# Patient Record
Sex: Male | Born: 1950 | Race: White | Hispanic: No | Marital: Married | Smoking: Never smoker
Health system: Southern US, Community
[De-identification: ages and names within clinical notes are randomized; demographics above are authoritative.]

## PROBLEM LIST (undated history)

## (undated) DIAGNOSIS — Z87442 Personal history of urinary calculi: Secondary | ICD-10-CM

## (undated) DIAGNOSIS — I10 Essential (primary) hypertension: Secondary | ICD-10-CM

## (undated) DIAGNOSIS — E785 Hyperlipidemia, unspecified: Secondary | ICD-10-CM

## (undated) DIAGNOSIS — M1990 Unspecified osteoarthritis, unspecified site: Secondary | ICD-10-CM

## (undated) HISTORY — DX: Essential (primary) hypertension: I10

## (undated) HISTORY — PX: MANDIBLE SURGERY: SHX707

## (undated) HISTORY — PX: HERNIA REPAIR: SHX51

## (undated) HISTORY — DX: Hyperlipidemia, unspecified: E78.5

---

## 2009-04-05 ENCOUNTER — Ambulatory Visit: Payer: Self-pay | Admitting: Unknown Physician Specialty

## 2009-04-05 LAB — HM COLONOSCOPY

## 2010-02-14 ENCOUNTER — Ambulatory Visit: Payer: Self-pay | Admitting: Surgery

## 2013-12-16 LAB — CBC AND DIFFERENTIAL
HCT: 47 % (ref 41–53)
HEMOGLOBIN: 16.8 g/dL (ref 13.5–17.5)
NEUTROS ABS: 57 /uL
PLATELETS: 226 10*3/uL (ref 150–399)
WBC: 6.9 10^3/mL

## 2013-12-16 LAB — HEPATIC FUNCTION PANEL
ALT: 15 U/L (ref 10–40)
AST: 25 U/L (ref 14–40)
Alkaline Phosphatase: 48 U/L (ref 25–125)
BILIRUBIN, TOTAL: 0.6 mg/dL

## 2013-12-16 LAB — PSA: PSA: 1.6

## 2013-12-16 LAB — LIPID PANEL
Cholesterol: 207 mg/dL — AB (ref 0–200)
HDL: 54 mg/dL (ref 35–70)
LDL CALC: 136 mg/dL
TRIGLYCERIDES: 85 mg/dL (ref 40–160)

## 2013-12-16 LAB — TSH: TSH: 2.63 u[IU]/mL (ref <=5.90)

## 2013-12-16 LAB — BASIC METABOLIC PANEL
BUN: 23 mg/dL — AB (ref 4–21)
CREATININE: 1 mg/dL (ref <=1.3)
Glucose: 103 mg/dL
Potassium: 4.5 mmol/L (ref 3.4–5.3)
Sodium: 140 mmol/L (ref 137–147)

## 2014-12-27 DIAGNOSIS — Z86010 Personal history of colonic polyps: Secondary | ICD-10-CM | POA: Insufficient documentation

## 2014-12-27 DIAGNOSIS — E785 Hyperlipidemia, unspecified: Secondary | ICD-10-CM | POA: Insufficient documentation

## 2014-12-27 DIAGNOSIS — J309 Allergic rhinitis, unspecified: Secondary | ICD-10-CM | POA: Insufficient documentation

## 2014-12-27 DIAGNOSIS — Z872 Personal history of diseases of the skin and subcutaneous tissue: Secondary | ICD-10-CM | POA: Insufficient documentation

## 2014-12-27 DIAGNOSIS — M1990 Unspecified osteoarthritis, unspecified site: Secondary | ICD-10-CM | POA: Insufficient documentation

## 2014-12-28 ENCOUNTER — Ambulatory Visit (INDEPENDENT_AMBULATORY_CARE_PROVIDER_SITE_OTHER): Payer: BC Managed Care – PPO | Admitting: Family Medicine

## 2014-12-28 ENCOUNTER — Encounter: Payer: Self-pay | Admitting: Family Medicine

## 2014-12-28 VITALS — BP 108/76 | HR 72 | Temp 98.8°F | Resp 12 | Ht 69.25 in | Wt 213.0 lb

## 2014-12-28 DIAGNOSIS — Z125 Encounter for screening for malignant neoplasm of prostate: Secondary | ICD-10-CM | POA: Diagnosis not present

## 2014-12-28 DIAGNOSIS — Z0000 Encounter for general adult medical examination without abnormal findings: Secondary | ICD-10-CM | POA: Diagnosis not present

## 2014-12-28 DIAGNOSIS — E7800 Pure hypercholesterolemia, unspecified: Secondary | ICD-10-CM

## 2014-12-28 LAB — POCT URINALYSIS DIPSTICK
Bilirubin, UA: NEGATIVE
Blood, UA: NEGATIVE
Glucose, UA: NEGATIVE
KETONES UA: NEGATIVE
Leukocytes, UA: NEGATIVE
Nitrite, UA: NEGATIVE
PH UA: 7
Protein, UA: NEGATIVE
SPEC GRAV UA: 1.015
Urobilinogen, UA: NEGATIVE

## 2014-12-28 LAB — HEMOCCULT GUIAC POC 1CARD (OFFICE): Fecal Occult Blood, POC: NEGATIVE

## 2014-12-28 MED ORDER — ROSUVASTATIN CALCIUM 10 MG PO TABS: 10.0000 mg | ORAL_TABLET | Freq: Every day | ORAL | Status: DC

## 2014-12-28 NOTE — Progress Notes (Signed)
Patient ID: Gabriel Mccormick. [name], male   DOB: September 05, 1950, 64 y.o.   MRN: [id number] Patient: Gabriel Mccormick, Male    DOB: 1950-09-18, 64 y.o.   MRN: [id number] Visit Date: 12/28/2014  Today's Provider: Wilhemena Durie, MD   Chief Complaint  Patient presents with  . Annual Exam   Subjective:  Gabriel Mccormick is a 64 y.o. male who presents today for health maintenance and complete physical. He feels well. He reports exercising almost daily basis walking, playing golf and yard work. He reports he is sleeping well.   Review of Systems  Constitutional: Negative.   HENT: Negative.   Eyes: Negative.   Respiratory: Negative.   Cardiovascular: Negative.   Gastrointestinal: Negative.   Endocrine: Negative.   Genitourinary: Negative.   Musculoskeletal: Positive for back pain (also some leg pain too, this does bother him with trying to mow his yard for example).  Skin: Negative.   Allergic/Immunologic: Negative.   Neurological: Negative.   Hematological: Negative.   Psychiatric/Behavioral: Negative.     History   Social History  . Marital Status: Married    Spouse Name: N/A  . Number of Children: 3  . Years of Education: college   Occupational History  . retired    Social History Main Topics  . Smoking status: Never Smoker   . Smokeless tobacco: Never Used  . Alcohol Use: Yes     Comment: maybe 3 drinks a month  . Drug Use: No  . Sexual Activity: Yes    Birth Control/ Protection: None   Other Topics Concern  . Not on file   Social History Narrative    History reviewed. No pertinent past medical history.  Past Surgical History  Procedure Laterality Date  . Hernia repair    . Mandible surgery      asymmetry corrected    His family history includes Cancer in his maternal grandfather; Dementia in his mother; Diabetes in his maternal grandmother and paternal grandmother; GER disease in his mother; Heart attack in his paternal grandmother; Leukemia in his paternal  grandfather; Lung cancer in his father.    Previous Medications   ASPIRIN 81 MG TABLET    Take by mouth.   IBUPROFEN (ADVIL,MOTRIN) 200 MG TABLET    Take by mouth.   MULTIPLE VITAMIN PO    Take by mouth.   OMEGA-3 FATTY ACIDS (FISH OIL) 1200 MG CAPS    Take by mouth.    Patient Care Team: Jerrol Banana., MD as PCP - General (Family Medicine)     Objective:   Vitals:  Filed Vitals:   12/28/14 0949  BP: 108/76  Pulse: 72  Temp: 98.8 F (37.1 C)  Resp: 12  Height: 5' 9.25" (1.759 m)  Weight: 213 lb (96.616 kg)    Physical Exam  Constitutional: He appears well-developed and well-nourished.  HENT:  Head: Normocephalic and atraumatic.  Right Ear: External ear normal.  Left Ear: External ear normal.  Mouth/Throat: Oropharynx is clear and moist.  Bilateral cerumen present  Eyes: Conjunctivae are normal. Pupils are equal, round, and reactive to light.  Neck: Normal range of motion.  Cardiovascular: Normal rate, regular rhythm and normal heart sounds.   Pulmonary/Chest: Effort normal and breath sounds normal.  Abdominal: Soft. Bowel sounds are normal.  Genitourinary: Rectum normal, prostate normal and penis normal. Guaiac negative stool.  Musculoskeletal: Normal range of motion.  Neurological: He is alert.  Skin: Skin is warm and dry.  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 His behavior is normal. Judgment and thought content normal.  Nursing note and vitals reviewed.      Assessment & Plan:     Routine Health Maintenance and Physical Exam  Exercise Activities and Dietary recommendations Goals    Discussed continuing working on habits.      Immunization History  Administered Date(s) Administered  . Tdap 12/01/2008  . Zoster 12/12/2011    Health Maintenance  Topic Date Due  . HIV Screening  06/08/1966  . INFLUENZA VACCINE  02/27/2015  . TETANUS/TDAP  12/02/2018  . COLONOSCOPY   04/06/2019  . ZOSTAVAX  Completed      Discussed health benefits of physical activity, and encouraged him to engage in regular exercise appropriate for his age and condition.    ------------------------------------------------------------------------------------------------------------         1. Annual physical exam Per patient had colonoscopy with Dr. Vira Agar in 2015 and he will provide that report. - POCT urinalysis dipstick - CBC w/Diff - Comp Met (CMET) - Lipid Panel With LDL/HDL Ratio - TSH - POCT Occult Blood Stool  2. Prostate cancer screening  - PSA  3. Elevated cholesterol Refill provided. - rosuvastatin (CRESTOR) 10 MG tablet; Take 1 tablet (10 mg total) by mouth daily.  Dispense: 30 tablet; Refill: 12  Patient was seen and examined by Dr. Eulas Post and note was scribed by Theressa Millard, RMA. I have done the exam and reviewed the above chart and it is accurate to the best of my knowledge.

## 2014-12-28 NOTE — Patient Instructions (Signed)
See Dermatologist

## 2014-12-29 ENCOUNTER — Encounter: Payer: Self-pay | Admitting: Family Medicine

## 2014-12-29 LAB — COMPREHENSIVE METABOLIC PANEL
ALBUMIN: 4.9 g/dL — AB (ref 3.6–4.8)
ALK PHOS: 49 IU/L (ref 39–117)
ALT: 15 IU/L (ref 0–44)
AST: 18 IU/L (ref 0–40)
Albumin/Globulin Ratio: 1.8 (ref 1.1–2.5)
BUN / CREAT RATIO: 20 (ref 10–22)
BUN: 22 mg/dL (ref 8–27)
Bilirubin Total: 0.8 mg/dL (ref 0.0–1.2)
CALCIUM: 9.9 mg/dL (ref 8.6–10.2)
CO2: 25 mmol/L (ref 18–29)
CREATININE: 1.11 mg/dL (ref 0.76–1.27)
Chloride: 97 mmol/L (ref 97–108)
GFR calc Af Amer: 81 mL/min/{1.73_m2} (ref >59)
GFR calc non Af Amer: 70 mL/min/{1.73_m2} (ref >59)
Globulin, Total: 2.7 g/dL (ref 1.5–4.5)
Glucose: 99 mg/dL (ref 65–99)
POTASSIUM: 4.8 mmol/L (ref 3.5–5.2)
SODIUM: 139 mmol/L (ref 134–144)
Total Protein: 7.6 g/dL (ref 6.0–8.5)

## 2014-12-29 LAB — LIPID PANEL WITH LDL/HDL RATIO
CHOLESTEROL TOTAL: 208 mg/dL — AB (ref 100–199)
HDL: 52 mg/dL (ref >39)
LDL Calculated: 130 mg/dL — ABNORMAL HIGH (ref 0–99)
LDl/HDL Ratio: 2.5 ratio units (ref 0.0–3.6)
TRIGLYCERIDES: 131 mg/dL (ref 0–149)
VLDL CHOLESTEROL CAL: 26 mg/dL (ref 5–40)

## 2014-12-29 LAB — CBC WITH DIFFERENTIAL/PLATELET
BASOS: 1 %
Basophils Absolute: 0 10*3/uL (ref 0.0–0.2)
EOS (ABSOLUTE): 0.2 10*3/uL (ref 0.0–0.4)
Eos: 2 %
HEMATOCRIT: 47.6 % (ref 37.5–51.0)
Hemoglobin: 16.6 g/dL (ref 12.6–17.7)
IMMATURE GRANS (ABS): 0 10*3/uL (ref 0.0–0.1)
Immature Granulocytes: 0 %
Lymphocytes Absolute: 1.8 10*3/uL (ref 0.7–3.1)
Lymphs: 22 %
MCH: 30.9 pg (ref 26.6–33.0)
MCHC: 34.9 g/dL (ref 31.5–35.7)
MCV: 89 fL (ref 79–97)
Monocytes Absolute: 0.7 10*3/uL (ref 0.1–0.9)
Monocytes: 9 %
NEUTROS PCT: 66 %
Neutrophils Absolute: 5.4 10*3/uL (ref 1.4–7.0)
Platelets: 228 10*3/uL (ref 150–379)
RBC: 5.38 x10E6/uL (ref 4.14–5.80)
RDW: 13 % (ref 12.3–15.4)
WBC: 8.2 10*3/uL (ref 3.4–10.8)

## 2014-12-29 LAB — PSA: PROSTATE SPECIFIC AG, SERUM: 1.4 ng/mL (ref 0.0–4.0)

## 2014-12-29 LAB — TSH: TSH: 2.72 u[IU]/mL (ref 0.450–4.500)

## 2015-01-02 ENCOUNTER — Telehealth: Payer: Self-pay | Admitting: Family Medicine

## 2015-01-02 NOTE — Telephone Encounter (Signed)
Advised that the results have not yet been signed off by Dr Reece AgarG but we will call when they are done.  ED

## 2015-01-02 NOTE — Telephone Encounter (Signed)
Pt is requesting lab results.  CB#[phone]/[id number]

## 2015-01-02 NOTE — Telephone Encounter (Signed)
Pt wanted to know if we had the lab results form 12/28/14 when he was here for his CPE. Thanks TNP

## 2015-01-03 NOTE — Telephone Encounter (Signed)
Pt advised-aa 

## 2015-01-03 NOTE — Telephone Encounter (Signed)
-----   Message from Maple Hudsonichard L Gilbert Jr., MD sent at 01/03/2015 11:16 AM EDT ----- Labs stable. Please advise patient.

## 2015-04-06 ENCOUNTER — Encounter: Payer: Self-pay | Admitting: Family Medicine

## 2015-04-06 ENCOUNTER — Ambulatory Visit (INDEPENDENT_AMBULATORY_CARE_PROVIDER_SITE_OTHER): Payer: BC Managed Care – PPO | Admitting: Family Medicine

## 2015-04-06 VITALS — BP 120/68 | HR 64 | Temp 97.9°F | Resp 16 | Wt 218.0 lb

## 2015-04-06 DIAGNOSIS — L255 Unspecified contact dermatitis due to plants, except food: Secondary | ICD-10-CM | POA: Diagnosis not present

## 2015-04-06 MED ORDER — MOMETASONE FUROATE 0.1 % EX CREA: 1.0000 "application " | TOPICAL_CREAM | Freq: Every day | CUTANEOUS | Status: DC

## 2015-04-06 MED ORDER — PREDNISONE 10 MG (48) PO TBPK: ORAL_TABLET | Freq: Every day | ORAL | Status: DC

## 2015-04-06 NOTE — Progress Notes (Signed)
Patient ID: Gabriel Mccormick, male   DOB: 1951/02/25, 64 y.o.   MRN: [id number]    Subjective:  HPI Pt reports that about a week ago he came home from playing golf and noticed a tick on his left groin area. He pulled it off and he had a red mark and a little rash around it, has not gotten bigger but then he noticed his foot on the same side as the tick bite was itching and now has a circular rash on his left foot that is not raised about the skin just red and itchy. He denies pain in this area and reports that he feels fine. He is going to the beach next week and wanted to get this checked out before he went.   Prior to Admission medications   Medication Sig Start Date End Date Taking? Authorizing Provider  aspirin 81 MG tablet Take by mouth. 12/15/12  Yes Historical Provider, MD  ibuprofen (ADVIL,MOTRIN) 200 MG tablet Take by mouth.   Yes Historical Provider, MD  MULTIPLE VITAMIN PO Take by mouth. 12/15/12  Yes Historical Provider, MD  Omega-3 Fatty Acids (FISH OIL) 1200 MG CAPS Take by mouth. 12/15/12  Yes Historical Provider, MD  rosuvastatin (CRESTOR) 10 MG tablet Take 1 tablet (10 mg total) by mouth daily. 12/28/14  Yes Violet Hulen Shouts., MD    Patient Active Problem List   Diagnosis Date Noted  . Allergic rhinitis 12/27/2014  . Personal history of disease of skin and subcutaneous tissue 12/27/2014  . History of colon polyps 12/27/2014  . HLD (hyperlipidemia) 12/27/2014  . Arthritis, degenerative 12/27/2014    History reviewed. No pertinent past medical history.  Social History   Social History  . Marital Status: Married    Spouse Name: N/A  . Number of Children: 3  . Years of Education: college   Occupational History  . retired    Social History Main Topics  . Smoking status: Never Smoker   . Smokeless tobacco: Never Used  . Alcohol Use: 0.0 oz/week    0 Standard drinks or equivalent per week     Comment: maybe 3 drinks a month  . Drug Use: No  . Sexual Activity:  Yes    Birth Control/ Protection: None   Other Topics Concern  . Not on file   Social History Narrative    No Known Allergies  Review of Systems  Constitutional: Negative.   HENT: Negative.   Eyes: Negative.   Respiratory: Negative.   Cardiovascular: Negative.   Gastrointestinal: Negative.   Genitourinary: Negative.   Musculoskeletal: Negative.   Skin: Positive for itching and rash.  Neurological: Negative.   Endo/Heme/Allergies: Negative.   Psychiatric/Behavioral: Negative.     Immunization History  Administered Date(s) Administered  . Tdap 12/01/2008  . Zoster 12/12/2011   Objective:  BP 120/68 mmHg  Pulse 64  Temp(Src) 97.9 F (36.6 C) (Oral)  Resp 16  Wt 218 lb (98.884 kg)  Physical Exam  Constitutional: He is oriented to person, place, and time and well-developed, well-nourished, and in no distress.  HENT:  Head: Normocephalic and atraumatic.  Right Ear: External ear normal.  Left Ear: External ear normal.  Nose: Nose normal.  Eyes: Conjunctivae are normal.  Neck: Neck supple.  Cardiovascular: Normal rate, regular rhythm and normal heart sounds.   Pulmonary/Chest: Effort normal and breath sounds normal.  Abdominal: Soft.  Neurological: He is alert and oriented to person, place, and time.  Skin: Skin is warm and dry.  Psychiatric: Mood, memory, affect and judgment normal.    Lab Results  Component Value Date   WBC 8.2 12/28/2014   HGB 16.8 12/16/2013   HCT 47.6 12/28/2014   PLT 226 12/16/2013   GLUCOSE 99 12/28/2014   CHOL 208* 12/28/2014   TRIG 131 12/28/2014   HDL 52 12/28/2014   LDLCALC 130* 12/28/2014   TSH 2.720 12/28/2014   PSA 1.4 12/28/2014    CMP     Component Value Date/Time   NA 139 12/28/2014 1049   K 4.8 12/28/2014 1049   CL 97 12/28/2014 1049   CO2 25 12/28/2014 1049   GLUCOSE 99 12/28/2014 1049   BUN 22 12/28/2014 1049   CREATININE 1.11 12/28/2014 1049   CREATININE 1.0 12/16/2013   CALCIUM 9.9 12/28/2014 1049   PROT  7.6 12/28/2014 1049   AST 18 12/28/2014 1049   ALT 15 12/28/2014 1049   ALKPHOS 49 12/28/2014 1049   BILITOT 0.8 12/28/2014 1049   GFRNONAA 70 12/28/2014 1049   GFRAA 81 12/28/2014 1049    Assessment and Plan :  1. Rhus dermatitis Benadryl/loratadine/ranitadine for itching. - mometasone (ELOCON) 0.1 % cream; Apply 1 application topically daily.  Dispense: 30 g; Refill: 0 - predniSONE (STERAPRED UNI-PAK 48 TAB) 10 MG (48) TBPK tablet; Take by mouth daily.  Dispense: 48 tablet; Refill: 0  2.Tick Bite Resolving. Julieanne Manson MD [facility] 04/06/2015 1:49 PM

## 2015-04-10 ENCOUNTER — Telehealth: Payer: Self-pay

## 2015-04-10 DIAGNOSIS — L03818 Cellulitis of other sites: Secondary | ICD-10-CM

## 2015-04-10 MED ORDER — DOXYCYCLINE HYCLATE 100 MG PO TABS: 100.0000 mg | ORAL_TABLET | Freq: Two times a day (BID) | ORAL | Status: DC

## 2015-04-10 NOTE — Telephone Encounter (Signed)
Per Dr. Gilbert-aa 

## 2016-01-02 ENCOUNTER — Ambulatory Visit (INDEPENDENT_AMBULATORY_CARE_PROVIDER_SITE_OTHER): Payer: BC Managed Care – PPO | Admitting: Family Medicine

## 2016-01-02 ENCOUNTER — Other Ambulatory Visit: Payer: Self-pay | Admitting: Family Medicine

## 2016-01-02 VITALS — BP 108/66 | HR 56 | Temp 98.0°F | Resp 16 | Ht 70.5 in | Wt 216.0 lb

## 2016-01-02 DIAGNOSIS — Z1211 Encounter for screening for malignant neoplasm of colon: Secondary | ICD-10-CM

## 2016-01-02 DIAGNOSIS — Z125 Encounter for screening for malignant neoplasm of prostate: Secondary | ICD-10-CM | POA: Diagnosis not present

## 2016-01-02 DIAGNOSIS — Z0000 Encounter for general adult medical examination without abnormal findings: Secondary | ICD-10-CM | POA: Diagnosis not present

## 2016-01-02 LAB — POCT URINALYSIS DIPSTICK
BILIRUBIN UA: NEGATIVE
GLUCOSE UA: NEGATIVE
Ketones, UA: NEGATIVE
Leukocytes, UA: NEGATIVE
Nitrite, UA: NEGATIVE
PH UA: 6
Protein, UA: NEGATIVE
RBC UA: NEGATIVE
SPEC GRAV UA: 1.015
Urobilinogen, UA: NEGATIVE

## 2016-01-02 LAB — IFOBT (OCCULT BLOOD): IFOBT: NEGATIVE

## 2016-01-02 NOTE — Progress Notes (Signed)
Patient ID: Gabriel Mccormick, male   DOB: 06-Oct-1950, 65 y.o.   MRN: [id number] Patient: Gabriel Mccormick, Male    DOB: [birth year], 65 y.o.   MRN: [id number] Visit Date: 01/02/2016  Today's Provider: Megan Mans, MD   Chief Complaint  Patient presents with  . Annual Exam   Subjective:  Gabriel Mccormick is a 65 y.o. male who presents today for health maintenance and complete physical. He feels well. He reports exercising daily. He reports he is sleeping well.  Immunization History  Administered Date(s) Administered  . Tdap 12/01/2008  . Zoster 12/12/2011   07/06/14 Colonoscopy -internal hemorrhoids, repeat in 5 years   Review of Systems  Constitutional: Negative.   HENT: Negative.   Eyes: Negative.   Respiratory: Negative.   Cardiovascular: Negative.   Gastrointestinal: Negative.   Endocrine: Negative.   Genitourinary: Negative.   Musculoskeletal: Negative.   Skin: Negative.   Allergic/Immunologic: Negative.   Neurological: Negative.   Hematological: Negative.   Psychiatric/Behavioral: Negative.     Social History   Social History  . Marital Status: Married    Spouse Name: N/A  . Number of Children: 3  . Years of Education: college   Occupational History  . retired    Social History Main Topics  . Smoking status: Never Smoker   . Smokeless tobacco: Never Used  . Alcohol Use: 0.0 oz/week    0 Standard drinks or equivalent per week     Comment: maybe 3 drinks a month  . Drug Use: No  . Sexual Activity: Yes    Birth Control/ Protection: None   Other Topics Concern  . Not on file   Social History Narrative    Patient Active Problem List   Diagnosis Date Noted  . Allergic rhinitis 12/27/2014  . Personal history of disease of skin and subcutaneous tissue 12/27/2014  . History of colon polyps 12/27/2014  . HLD (hyperlipidemia) 12/27/2014  . Arthritis, degenerative 12/27/2014    Past Surgical History  Procedure Laterality Date  . Hernia repair     . Mandible surgery      asymmetry corrected    His family history includes Cancer in his maternal grandfather; Dementia in his mother; Diabetes in his maternal grandmother and paternal grandmother; GER disease in his mother; Heart attack in his paternal grandmother; Leukemia in his paternal grandfather; Lung cancer in his father.    Outpatient Prescriptions Prior to Visit  Medication Sig Dispense Refill  . aspirin 81 MG tablet Take by mouth.    Marland Kitchen ibuprofen (ADVIL,MOTRIN) 200 MG tablet Take by mouth.    . MULTIPLE VITAMIN PO Take by mouth.    . Omega-3 Fatty Acids (FISH OIL) 1200 MG CAPS Take by mouth.    . rosuvastatin (CRESTOR) 10 MG tablet Take 1 tablet (10 mg total) by mouth daily. 30 tablet 12  . doxycycline (VIBRA-TABS) 100 MG tablet Take 1 tablet (100 mg total) by mouth 2 (two) times daily. 14 tablet 1  . mometasone (ELOCON) 0.1 % cream Apply 1 application topically daily. 30 g 0  . predniSONE (STERAPRED UNI-PAK 48 TAB) 10 MG (48) TBPK tablet Take by mouth daily. 48 tablet 0   No facility-administered medications prior to visit.    Patient Care Team: Maple Hudson., MD as PCP - General (Family Medicine)     Objective:   Vitals:  Filed Vitals:   01/02/16 0821  BP: 108/66  Pulse: 56  Temp: 98 F (36.7 C)  TempSrc:  Oral  Resp: 16  Height: 5' 10.5" (1.791 m)  Weight: 216 lb (97.977 kg)    Physical Exam  Constitutional: He is oriented to person, place, and time. He appears well-developed and well-nourished.  HENT:  Head: Normocephalic and atraumatic.  Right Ear: External ear normal.  Left Ear: External ear normal.  Nose: Nose normal.  Mouth/Throat: Oropharynx is clear and moist.  Eyes: Conjunctivae and EOM are normal. Pupils are equal, round, and reactive to light.  Neck: Normal range of motion. Neck supple.  Cardiovascular: Normal rate, regular rhythm, normal heart sounds and intact distal pulses.   Pulmonary/Chest: Effort normal and breath sounds normal.   Abdominal: Soft. Bowel sounds are normal.  Genitourinary: Rectum normal, prostate normal and penis normal.  Musculoskeletal: Normal range of motion.  Neurological: He is alert and oriented to person, place, and time.  Skin: Skin is warm and dry.  Psychiatric: He has a normal mood and affect. His behavior is normal. Judgment and thought content normal.     Depression Screen PHQ 2/9 Scores 01/02/2016  PHQ - 2 Score 0      Assessment & Plan:     Routine Health Maintenance and Physical Exam  Exercise Activities and Dietary recommendations Goals    None      Immunization History  Administered Date(s) Administered  . Tdap 12/01/2008  . Zoster 12/12/2011    Health Maintenance  Topic Date Due  . Hepatitis C Screening  [birth year]  . HIV Screening  06/08/1966  . INFLUENZA VACCINE  02/27/2016  . TETANUS/TDAP  12/02/2018  . COLONOSCOPY  04/06/2019  . ZOSTAVAX  Completed      Discussed health benefits of physical activity, and encouraged him to engage in regular exercise appropriate for his age and condition.    ------------------------------------------------------------------------------------------------------------

## 2016-01-03 LAB — COMPREHENSIVE METABOLIC PANEL
ALBUMIN: 4.9 g/dL — AB (ref 3.6–4.8)
ALK PHOS: 56 IU/L (ref 39–117)
ALT: 10 IU/L (ref 0–44)
AST: 16 IU/L (ref 0–40)
Albumin/Globulin Ratio: 2.1 (ref 1.2–2.2)
BILIRUBIN TOTAL: 0.6 mg/dL (ref 0.0–1.2)
BUN / CREAT RATIO: 19 (ref 10–24)
BUN: 17 mg/dL (ref 8–27)
CALCIUM: 9.7 mg/dL (ref 8.6–10.2)
CHLORIDE: 99 mmol/L (ref 96–106)
CO2: 25 mmol/L (ref 18–29)
Creatinine, Ser: 0.91 mg/dL (ref 0.76–1.27)
GFR calc Af Amer: 103 mL/min/{1.73_m2} (ref >59)
GFR calc non Af Amer: 89 mL/min/{1.73_m2} (ref >59)
GLOBULIN, TOTAL: 2.3 g/dL (ref 1.5–4.5)
GLUCOSE: 95 mg/dL (ref 65–99)
Potassium: 4.9 mmol/L (ref 3.5–5.2)
SODIUM: 141 mmol/L (ref 134–144)
Total Protein: 7.2 g/dL (ref 6.0–8.5)

## 2016-01-03 LAB — CBC WITH DIFFERENTIAL/PLATELET
BASOS ABS: 0.1 10*3/uL (ref 0.0–0.2)
Basos: 1 %
EOS (ABSOLUTE): 0.1 10*3/uL (ref 0.0–0.4)
EOS: 2 %
HEMATOCRIT: 44.2 % (ref 37.5–51.0)
HEMOGLOBIN: 15.2 g/dL (ref 12.6–17.7)
IMMATURE GRANS (ABS): 0 10*3/uL (ref 0.0–0.1)
IMMATURE GRANULOCYTES: 0 %
LYMPHS ABS: 1.7 10*3/uL (ref 0.7–3.1)
LYMPHS: 26 %
MCH: 30.9 pg (ref 26.6–33.0)
MCHC: 34.4 g/dL (ref 31.5–35.7)
MCV: 90 fL (ref 79–97)
MONOCYTES: 7 %
Monocytes Absolute: 0.4 10*3/uL (ref 0.1–0.9)
NEUTROS PCT: 64 %
Neutrophils Absolute: 4.1 10*3/uL (ref 1.4–7.0)
Platelets: 210 10*3/uL (ref 150–379)
RBC: 4.92 x10E6/uL (ref 4.14–5.80)
RDW: 13.6 % (ref 12.3–15.4)
WBC: 6.4 10*3/uL (ref 3.4–10.8)

## 2016-01-03 LAB — PSA: Prostate Specific Ag, Serum: 3.4 ng/mL (ref 0.0–4.0)

## 2016-01-03 LAB — LIPID PANEL WITH LDL/HDL RATIO
CHOLESTEROL TOTAL: 173 mg/dL (ref 100–199)
HDL: 47 mg/dL (ref >39)
LDL Calculated: 104 mg/dL — ABNORMAL HIGH (ref 0–99)
LDl/HDL Ratio: 2.2 ratio units (ref 0.0–3.6)
TRIGLYCERIDES: 109 mg/dL (ref 0–149)
VLDL Cholesterol Cal: 22 mg/dL (ref 5–40)

## 2016-01-03 LAB — TSH: TSH: 2.56 u[IU]/mL (ref 0.450–4.500)

## 2016-01-08 ENCOUNTER — Other Ambulatory Visit: Payer: Self-pay | Admitting: Emergency Medicine

## 2016-01-08 ENCOUNTER — Other Ambulatory Visit: Payer: Self-pay | Admitting: Family Medicine

## 2016-01-08 DIAGNOSIS — N410 Acute prostatitis: Secondary | ICD-10-CM

## 2016-01-08 MED ORDER — SULFAMETHOXAZOLE-TRIMETHOPRIM 800-160 MG PO TABS: 1.0000 | ORAL_TABLET | Freq: Two times a day (BID) | ORAL | Status: DC

## 2016-01-31 ENCOUNTER — Telehealth: Payer: Self-pay | Admitting: Family Medicine

## 2016-01-31 DIAGNOSIS — R972 Elevated prostate specific antigen [PSA]: Secondary | ICD-10-CM

## 2016-01-31 NOTE — Telephone Encounter (Signed)
Informed pt per Dr. Elisabeth CaraGilbert's last lab note. He needed PSA rechecked in 1 month from 01/02/16. Labs slip ready for pick up.

## 2016-01-31 NOTE — Telephone Encounter (Signed)
Pt states he was taking medication to help with is PSA level.  Pt has completed the medication and is asking if he needs his labs rechecked.  CB#[phone]/MW

## 2016-02-01 ENCOUNTER — Other Ambulatory Visit: Payer: Self-pay | Admitting: Family Medicine

## 2016-02-02 LAB — PSA: Prostate Specific Ag, Serum: 1.6 ng/mL (ref 0.0–4.0)

## 2016-02-07 ENCOUNTER — Telehealth: Payer: Self-pay | Admitting: Emergency Medicine

## 2016-02-07 NOTE — Telephone Encounter (Signed)
Pt requesting las results. Thanks.

## 2017-01-02 ENCOUNTER — Encounter: Payer: BC Managed Care – PPO | Admitting: Family Medicine

## 2017-01-08 ENCOUNTER — Ambulatory Visit (INDEPENDENT_AMBULATORY_CARE_PROVIDER_SITE_OTHER): Payer: Medicare Other | Admitting: Family Medicine

## 2017-01-08 ENCOUNTER — Encounter: Payer: Self-pay | Admitting: Family Medicine

## 2017-01-08 VITALS — BP 122/78 | HR 68 | Temp 97.7°F | Resp 16 | Wt 221.0 lb

## 2017-01-08 DIAGNOSIS — Z0000 Encounter for general adult medical examination without abnormal findings: Secondary | ICD-10-CM

## 2017-01-08 MED ORDER — ROSUVASTATIN CALCIUM 10 MG PO TABS: 10.0000 mg | ORAL_TABLET | Freq: Every day | ORAL | 3 refills | Status: DC

## 2017-01-08 NOTE — Progress Notes (Signed)
Patient: Gabriel Mccormick. Cavanagh, Male    DOB: 1951-05-18, 66 y.o.   MRN: [id number] Visit Date: 01/08/2017  Today's Provider: Megan Mans, MD   Chief Complaint  Patient presents with  . Medicare Wellness   Subjective:   Gabriel Mccormick is a 66 y.o. male who presents today for his Subsequent Annual Wellness Visit. He feels well. He reports exercising some golf, walking and yard work. He reports he is sleeping well.Welcome to Medicare Newborn 6 yo granddaughter.  Colonoscopy- 07/06/14 repeat 5 years  Immunization History  Administered Date(s) Administered  . Tdap 12/01/2008  . Zoster 12/12/2011      Review of Systems  Constitutional: Negative.   HENT: Positive for dental problem and tinnitus.   Eyes: Negative.   Respiratory: Negative.   Cardiovascular: Negative.   Gastrointestinal: Negative.   Endocrine: Negative.   Genitourinary: Negative.   Musculoskeletal: Negative.   Skin: Negative.   Allergic/Immunologic: Negative.   Neurological: Negative.   Hematological: Negative.   Psychiatric/Behavioral: Negative.     Patient Active Problem List   Diagnosis Date Noted  . Allergic rhinitis 12/27/2014  . Personal history of disease of skin and subcutaneous tissue 12/27/2014  . History of colon polyps 12/27/2014  . HLD (hyperlipidemia) 12/27/2014  . Arthritis, degenerative 12/27/2014    Social History   Social History  . Marital status: Married    Spouse name: N/A  . Number of children: 3  . Years of education: college   Occupational History  . retired    Social History Main Topics  . Smoking status: Never Smoker  . Smokeless tobacco: Never Used  . Alcohol use 0.0 oz/week     Comment: maybe 3 drinks a month  . Drug use: No  . Sexual activity: Yes    Birth control/ protection: None   Other Topics Concern  . Not on file   Social History Narrative  . No narrative on file    Past Surgical History:  Procedure Laterality Date  . HERNIA REPAIR    .  MANDIBLE SURGERY     asymmetry corrected    His family history includes Cancer in his maternal grandfather; Dementia in his mother; Diabetes in his maternal grandmother and paternal grandmother; GER disease in his mother; Heart attack in his paternal grandmother; Leukemia in his paternal grandfather; Lung cancer in his father.     Outpatient Medications Prior to Visit  Medication Sig Dispense Refill  . aspirin 81 MG tablet Take by mouth.    Marland Kitchen ibuprofen (ADVIL,MOTRIN) 200 MG tablet Take by mouth.    . MULTIPLE VITAMIN PO Take by mouth.    . rosuvastatin (CRESTOR) 10 MG tablet TAKE 1 TABLET EVERY DAY USUALLY IN THE EVENING 30 tablet 12  . Omega-3 Fatty Acids (FISH OIL) 1200 MG CAPS Take by mouth.    . sulfamethoxazole-trimethoprim (BACTRIM DS,SEPTRA DS) 800-160 MG tablet Take 1 tablet by mouth 2 (two) times daily. (Patient not taking: Reported on 01/08/2017) 28 tablet 0   No facility-administered medications prior to visit.     No Known Allergies  Patient Care Team: Maple Hudson., MD as PCP - General (Family Medicine)   Objective:   Vitals:  Vitals:   01/08/17 0921  BP: 122/78  Pulse: 68  Resp: 16  Temp: 97.7 F (36.5 C)  TempSrc: Oral  Weight: 221 lb (100.2 kg)    Visual Acuity Screening   Right eye Left eye Both eyes  Without correction:     With  correction: 20/20 20/25       Physical Exam  Constitutional: He is oriented to person, place, and time. He appears well-developed and well-nourished.  HENT:  Head: Normocephalic and atraumatic.  Right Ear: External ear normal.  Left Ear: External ear normal.  Nose: Nose normal.  Mouth/Throat: Oropharynx is clear and moist.  Eyes: Conjunctivae and EOM are normal. Pupils are equal, round, and reactive to light.  Neck: Normal range of motion. Neck supple.  Cardiovascular: Normal rate, regular rhythm, normal heart sounds and intact distal pulses.   Pulmonary/Chest: Effort normal and breath sounds normal.   Abdominal: Soft. Bowel sounds are normal.  Musculoskeletal: Normal range of motion.  Neurological: He is alert and oriented to person, place, and time. He has normal reflexes.  Skin: Skin is warm and dry.  Psychiatric: He has a normal mood and affect. His behavior is normal. Judgment and thought content normal.    Activities of Daily Living In your present state of health, do you have any difficulty performing the following activities: 01/08/2017  Hearing? N  Vision? N  Difficulty concentrating or making decisions? N  Walking or climbing stairs? N  Dressing or bathing? N  Doing errands, shopping? N  Some recent data might be hidden    Fall Risk Assessment Fall Risk  01/08/2017 01/02/2016  Falls in the past year? No No     Depression Screen PHQ 2/9 Scores 01/08/2017 01/08/2017 01/02/2016  PHQ - 2 Score 0 0 0  PHQ- 9 Score - 0 -    Cognitive Testing - 6-CIT    Year: 0 points  Month: 0 points  Memorize "Gabriel Mccormick, [place], [address], [address], [place]"  Time (within 1 hour:) 0 points  Count backwards from 20: 0 points  Name months of year: 0 points  Repeat Address: 0  points   Total Score: 0/28  Interpretation : Normal (0-7) Abnormal (8-28)    Assessment & Plan:     Annual Wellness Visit  Reviewed patient's Family Medical History Reviewed and updated list of patient's medical providers Assessment of cognitive impairment was done Assessed patient's functional ability Established a written schedule for health screening services Health Risk Assessent Completed and Reviewed  Exercise Activities and Dietary recommendations Goals    None      Immunization History  Administered Date(s) Administered  . Tdap 12/01/2008  . Zoster 12/12/2011    Health Maintenance  Topic Date Due  . Hepatitis C Screening  1950/12/12  . HIV Screening  06/08/1966  . PNA vac Low Risk Adult (1 of 2 - PCV13) 06/08/2016  . INFLUENZA VACCINE  02/26/2017  . TETANUS/TDAP  12/02/2018  . COLONOSCOPY   04/06/2019     Discussed health benefits of physical activity, and encouraged him to engage in regular exercise appropriate for his age and condition.    Julieanne Mansonichard Gabriel Seltzer MD [facility] 01/08/2017 9:25 AM  ------------------------------------------------------------------------------------------------------------

## 2017-03-24 ENCOUNTER — Other Ambulatory Visit: Payer: Self-pay | Admitting: Family Medicine

## 2017-03-24 ENCOUNTER — Encounter: Payer: Self-pay | Admitting: Family Medicine

## 2017-03-24 ENCOUNTER — Ambulatory Visit (INDEPENDENT_AMBULATORY_CARE_PROVIDER_SITE_OTHER): Payer: Medicare Other | Admitting: Family Medicine

## 2017-03-24 VITALS — BP 122/80 | HR 70 | Temp 97.8°F | Resp 16 | Wt 222.0 lb

## 2017-03-24 DIAGNOSIS — E7800 Pure hypercholesterolemia, unspecified: Secondary | ICD-10-CM | POA: Diagnosis not present

## 2017-03-24 DIAGNOSIS — M2550 Pain in unspecified joint: Secondary | ICD-10-CM | POA: Diagnosis not present

## 2017-03-24 NOTE — Progress Notes (Signed)
Patient: Gabriel Mccormick. [place] Male    DOB: 12/23/1950   66 y.o.   MRN: [id number] Visit Date: 03/24/2017  Today's Provider: Megan Mans, MD   Chief Complaint  Patient presents with  . Follow-up    lab work  . Hyperlipidemia   Subjective:    HPI Pt is here today for labs, as he could not get them at his [facility] because it was an initial wellness. He reports that he has been feeling pretty good other than some right sided joint pains. He would like to see if he could get lyme's disease testing added to his lab work. He was bitten by 2 ticks with in the last 2 years. One spot recently "flared up" and hes been having joint pain in right knee, and elbow.     Lipid/Cholesterol, Follow-up:   Last seen for this2 months ago.  Management changes since that visit include none. . Last Lipid Panel:    Component Value Date/Time   CHOL 173 01/02/2016 0951   TRIG 109 01/02/2016 0951   HDL 47 01/02/2016 0951   LDLCALC 104 (H) 01/02/2016 0951    Risk factors for vascular disease include hypercholesterolemia  He reports good compliance with treatment. He is not having side effects.  Current exercise: walking, yard work and golf 4 days a week.   Wt Readings from Last 3 Encounters:  03/24/17 222 lb (100.7 kg)  01/08/17 221 lb (100.2 kg)  01/02/16 216 lb (98 kg)   -------------------------------------------------------------------    No Known Allergies   Current Outpatient Prescriptions:  .  aspirin 81 MG tablet, Take by mouth., Disp: , Rfl:  .  ibuprofen (ADVIL,MOTRIN) 200 MG tablet, Take by mouth., Disp: , Rfl:  .  MULTIPLE VITAMIN PO, Take by mouth., Disp: , Rfl:  .  rosuvastatin (CRESTOR) 10 MG tablet, Take 1 tablet (10 mg total) by mouth daily., Disp: 90 tablet, Rfl: 3 .  Omega-3 Fatty Acids (FISH OIL) 1200 MG CAPS, Take by mouth., Disp: , Rfl:   Review of Systems  Constitutional: Negative.   HENT: Negative.   Eyes: Negative.   Respiratory: Negative.     Cardiovascular: Negative.   Gastrointestinal: Negative.   Endocrine: Negative.   Genitourinary: Negative.   Musculoskeletal: Positive for arthralgias.  Skin: Negative.   Allergic/Immunologic: Negative.   Neurological: Negative.   Hematological: Negative.   Psychiatric/Behavioral: Negative.     Social History  Substance Use Topics  . Smoking status: Never Smoker  . Smokeless tobacco: Never Used  . Alcohol use 0.0 oz/week     Comment: maybe 3 drinks a month   Objective:   BP 122/80 (BP Location: Left Arm, Patient Position: Sitting, Cuff Size: Normal)   Pulse 70   Temp 97.8 F (36.6 C) (Oral)   Resp 16   Wt 222 lb (100.7 kg)   BMI 31.40 kg/m  Vitals:   03/24/17 0819  BP: 122/80  Pulse: 70  Resp: 16  Temp: 97.8 F (36.6 C)  TempSrc: Oral  Weight: 222 lb (100.7 kg)     Physical Exam  Constitutional: He is oriented to person, place, and time. He appears well-developed and well-nourished.  Eyes: Pupils are equal, round, and reactive to light. Conjunctivae and EOM are normal.  Neck: Normal range of motion. Neck supple.  Cardiovascular: Normal rate, regular rhythm, normal heart sounds and intact distal pulses.   Pulmonary/Chest: Effort normal and breath sounds normal.  Musculoskeletal: Normal range of motion. He exhibits  no edema, tenderness or deformity.  Neurological: He is alert and oriented to person, place, and time. He has normal reflexes.  Skin: Skin is warm and dry.  Psychiatric: He has a normal mood and affect. His behavior is normal. Judgment and thought content normal.        Assessment & Plan:     1. Pure hypercholesterolemia  - Lipid Panel With LDL/HDL Ratio - Comprehensive metabolic panel  2. Arthralgia, unspecified joint Pt worried about Lymes but nonspecific mild arthralgia of right knee and elbow are only symptoms--I reassured him. - CBC with Differential/Platelet - TSH - Lyme Ab/Western Blot Reflex     HPI, Exam, and A&P Transcribed under  the direction and in the presence of Chazz L. Wendelyn Breslow, MD  Electronically Signed: Silvio Pate, CMA  I have done the exam and reviewed the above chart and it is accurate to the best of my knowledge. Dentist has been used in this note in any air is in the dictation or transcription are unintentional.  Megan Mans, MD  [facility]

## 2017-03-26 LAB — LIPID PANEL WITH LDL/HDL RATIO
Cholesterol, Total: 181 mg/dL (ref 100–199)
HDL: 44 mg/dL (ref >39)
LDL CALC: 110 mg/dL — AB (ref 0–99)
LDL/HDL RATIO: 2.5 ratio (ref 0.0–3.6)
TRIGLYCERIDES: 133 mg/dL (ref 0–149)
VLDL Cholesterol Cal: 27 mg/dL (ref 5–40)

## 2017-03-26 LAB — COMPREHENSIVE METABOLIC PANEL
A/G RATIO: 1.8 (ref 1.2–2.2)
ALBUMIN: 4.6 g/dL (ref 3.6–4.8)
ALT: 15 IU/L (ref 0–44)
AST: 22 IU/L (ref 0–40)
Alkaline Phosphatase: 50 IU/L (ref 39–117)
BUN / CREAT RATIO: 19 (ref 10–24)
BUN: 17 mg/dL (ref 8–27)
Bilirubin Total: 0.6 mg/dL (ref 0.0–1.2)
CALCIUM: 9.6 mg/dL (ref 8.6–10.2)
CO2: 24 mmol/L (ref 20–29)
Chloride: 102 mmol/L (ref 96–106)
Creatinine, Ser: 0.91 mg/dL (ref 0.76–1.27)
GFR, EST AFRICAN AMERICAN: 102 mL/min/{1.73_m2} (ref >59)
GFR, EST NON AFRICAN AMERICAN: 88 mL/min/{1.73_m2} (ref >59)
GLOBULIN, TOTAL: 2.5 g/dL (ref 1.5–4.5)
Glucose: 111 mg/dL — ABNORMAL HIGH (ref 65–99)
Potassium: 4.8 mmol/L (ref 3.5–5.2)
SODIUM: 142 mmol/L (ref 134–144)
TOTAL PROTEIN: 7.1 g/dL (ref 6.0–8.5)

## 2017-03-26 LAB — CBC WITH DIFFERENTIAL/PLATELET
BASOS: 0 %
Basophils Absolute: 0 10*3/uL (ref 0.0–0.2)
EOS (ABSOLUTE): 0.2 10*3/uL (ref 0.0–0.4)
EOS: 3 %
HEMATOCRIT: 44.8 % (ref 37.5–51.0)
HEMOGLOBIN: 15.3 g/dL (ref 13.0–17.7)
IMMATURE GRANULOCYTES: 0 %
Immature Grans (Abs): 0 10*3/uL (ref 0.0–0.1)
Lymphocytes Absolute: 1.8 10*3/uL (ref 0.7–3.1)
Lymphs: 25 %
MCH: 31.1 pg (ref 26.6–33.0)
MCHC: 34.2 g/dL (ref 31.5–35.7)
MCV: 91 fL (ref 79–97)
MONOCYTES: 10 %
MONOS ABS: 0.7 10*3/uL (ref 0.1–0.9)
NEUTROS PCT: 62 %
Neutrophils Absolute: 4.3 10*3/uL (ref 1.4–7.0)
Platelets: 217 10*3/uL (ref 150–379)
RBC: 4.92 x10E6/uL (ref 4.14–5.80)
RDW: 13.6 % (ref 12.3–15.4)
WBC: 7.1 10*3/uL (ref 3.4–10.8)

## 2017-03-26 LAB — TSH: TSH: 2.96 u[IU]/mL (ref 0.450–4.500)

## 2017-03-26 LAB — LYME AB/WESTERN BLOT REFLEX

## 2017-07-31 ENCOUNTER — Ambulatory Visit (INDEPENDENT_AMBULATORY_CARE_PROVIDER_SITE_OTHER): Payer: Medicare Other

## 2017-07-31 ENCOUNTER — Ambulatory Visit (INDEPENDENT_AMBULATORY_CARE_PROVIDER_SITE_OTHER): Payer: Medicare Other | Admitting: Orthopaedic Surgery

## 2017-07-31 ENCOUNTER — Encounter (INDEPENDENT_AMBULATORY_CARE_PROVIDER_SITE_OTHER): Payer: Self-pay | Admitting: Orthopaedic Surgery

## 2017-07-31 DIAGNOSIS — M25551 Pain in right hip: Secondary | ICD-10-CM | POA: Insufficient documentation

## 2017-07-31 DIAGNOSIS — M1611 Unilateral primary osteoarthritis, right hip: Secondary | ICD-10-CM | POA: Diagnosis not present

## 2017-07-31 MED ORDER — DIFLUNISAL 500 MG PO TABS: 500.0000 mg | ORAL_TABLET | Freq: Two times a day (BID) | ORAL | 2 refills | Status: DC | PRN

## 2017-07-31 NOTE — Progress Notes (Signed)
Office Visit Note   Patient: Gabriel Mccormick. Gabriel Mccormick           Date of Birth: 1951-03-07           MRN: [id number] Visit Date: 07/31/2017              Requested by: Gabriel Mccormick., MD [address] [place], [place] [address] PCP: Gabriel Mccormick., MD   Assessment & Plan: Visit Diagnoses:  1. Pain in right hip   2. Unilateral primary osteoarthritis, right hip     Plan: Right now he understands we have a diagnosis of severe end-stage arthritis of his right hip.  He says is not bothering him enough right now to pursue surgery and he would like to get his wife through her upcoming heart valve replacement surgery which she has next week.  Right now has been continue activity modification and the anti-inflammatories.  I sent him some more for him.  He can always call to be set up for surgery if he needs to or even consider an intra-articular injection.  We spent a long time going over his x-rays and talked about anterior hip surgery as well.  All questions and concerns were answered and addressed.  Follow-Up Instructions: Return if symptoms worsen or fail to improve.   Orders:  Orders Placed This Encounter  Procedures  . XR HIP UNILAT W OR W/O PELVIS 1V RIGHT   Meds ordered this encounter  Medications  . diflunisal (DOLOBID) 500 MG TABS tablet    Sig: Take 1 tablet (500 mg total) by mouth 2 (two) times daily as needed.    Dispense:  60 tablet    Refill:  2      Procedures: No procedures performed   Clinical Data: No additional findings.   Subjective: Chief Complaint  Patient presents with  . Right Hip - Pain  The patient is listed as new patient seen before with his wife who I did surgery on him.  He said one year history of worsening right leg pain it mainly hurts in the groin but sometimes it radiates to his knee.  Said that right hip is very stiff.  He cannot lay without pain on the right side.  It hurts him when he plays golf.  Denies any back pain  and denies any radicular symptoms.  He has been taking an anti-inflammatory called diflunisal which is helped take the edge off of things.  He is not a smoker not a diabetic.  HPI  Review of Systems He currently denies any headache, chest pain, shortness of breath, fever, chills, nausea, vomiting.  Objective: Vital Signs: There were no vitals taken for this visit.  Physical Exam He is alert and oriented x3 and in no acute distress Ortho Exam Examination of his left hip is normal.  Examination of his right hip shows pain with any attempts of internal or external rotation.  He has quite stiff and there is some limitations the rotation is Specialty Comments:  No specialty comments available.  Imaging: Xr Hip Unilat W Or W/o Pelvis 1v Right  Result Date: 07/31/2017 An AP pelvis and lateral of his right hip shows severe end-stage arthritis of the right hip.  There is complete loss of superior lateral joint space.  There is flattening of the femoral head with periarticular osteophytes and sclerotic changes on both the acetabulum and the femoral head.    PMFS History: Patient Active Problem List   Diagnosis Date  Noted  . Pain in right hip 07/31/2017  . Unilateral primary osteoarthritis, right hip 07/31/2017  . Allergic rhinitis 12/27/2014  . Personal history of disease of skin and subcutaneous tissue 12/27/2014  . History of colon polyps 12/27/2014  . HLD (hyperlipidemia) 12/27/2014  . Arthritis, degenerative 12/27/2014   History reviewed. No pertinent past medical history.  Family History  Problem Relation Age of Onset  . GER disease Mother   . Dementia Mother   . Lung cancer Father   . Cancer Maternal Grandfather   . Diabetes Paternal Grandmother   . Heart attack Paternal Grandmother   . Leukemia Paternal Grandfather   . Diabetes Maternal Grandmother     Past Surgical History:  Procedure Laterality Date  . HERNIA REPAIR    . MANDIBLE SURGERY     asymmetry corrected    Social History   Occupational History  . Occupation: retired  Tobacco Use  . Smoking status: Never Smoker  . Smokeless tobacco: Never Used  Substance and Sexual Activity  . Alcohol use: Yes    Alcohol/week: 0.0 oz    Comment: maybe 3 drinks a month  . Drug use: No  . Sexual activity: Yes    Birth control/protection: None

## 2017-09-17 ENCOUNTER — Ambulatory Visit: Payer: Medicare Other | Admitting: Family Medicine

## 2017-09-17 ENCOUNTER — Other Ambulatory Visit: Payer: Self-pay

## 2017-09-17 VITALS — BP 152/98 | HR 80 | Temp 98.2°F | Resp 14

## 2017-09-17 DIAGNOSIS — I10 Essential (primary) hypertension: Secondary | ICD-10-CM

## 2017-09-17 DIAGNOSIS — J40 Bronchitis, not specified as acute or chronic: Secondary | ICD-10-CM | POA: Diagnosis not present

## 2017-09-17 MED ORDER — AMLODIPINE BESYLATE 5 MG PO TABS: 5.0000 mg | ORAL_TABLET | Freq: Every day | ORAL | 3 refills | Status: DC

## 2017-09-17 MED ORDER — DOXYCYCLINE HYCLATE 100 MG PO TABS: 100.0000 mg | ORAL_TABLET | Freq: Two times a day (BID) | ORAL | 0 refills | Status: DC

## 2017-09-17 NOTE — Progress Notes (Signed)
Gabriel Mccormick  MRN: [id number] DOB: 02/18/1951  Subjective:  HPI   The patient is a 67 year old male who about a week ago started having some minor cough and head congestion.  Over the last 3 days or so he has been coughing yellowish/green sputum.  This morning he woke up sweaty.  He has not felt like he had chills or fever, but may have during the night.    After taking the vital signs and getting an elevated blood pressure today the patient admits that he has had some high readings recently.  The patient states that when he has checked it at home he was getting 140-150 over 90's and some close to 100.  Patient Active Problem List   Diagnosis Date Noted  . Pain in right hip 07/31/2017  . Unilateral primary osteoarthritis, right hip 07/31/2017  . Allergic rhinitis 12/27/2014  . Personal history of disease of skin and subcutaneous tissue 12/27/2014  . History of colon polyps 12/27/2014  . HLD (hyperlipidemia) 12/27/2014  . Arthritis, degenerative 12/27/2014    No past medical history on file.  Social History   Socioeconomic History  . Marital status: Married    Spouse name: Not on file  . Number of children: 3  . Years of education: college  . Highest education level: Not on file  Social Needs  . Financial resource strain: Not on file  . Food insecurity - worry: Not on file  . Food insecurity - inability: Not on file  . Transportation needs - medical: Not on file  . Transportation needs - non-medical: Not on file  Occupational History  . Occupation: retired  Tobacco Use  . Smoking status: Never Smoker  . Smokeless tobacco: Never Used  Substance and Sexual Activity  . Alcohol use: Yes    Alcohol/week: 0.0 oz    Comment: maybe 3 drinks a month  . Drug use: No  . Sexual activity: Yes    Birth control/protection: None  Other Topics Concern  . Not on file  Social History Narrative  . Not on file    Outpatient Encounter Medications as of 09/17/2017  Medication  Sig Note  . aspirin 81 MG tablet Take by mouth. 12/27/2014: [facility]: [facility]  . diflunisal (DOLOBID) 500 MG TABS tablet Take 1 tablet (500 mg total) by mouth 2 (two) times daily as needed.   Marland Kitchen. ibuprofen (ADVIL,MOTRIN) 200 MG tablet Take by mouth. 12/28[facility]2014: [facility]  . MULTIPLE VITAMIN PO Take by mouth. 12/27/2014: Received from: [facility]  . Omega-3 Fatty Acids (FISH OIL) 1200 MG CAPS Take by mouth. 12/27/2014: Received from: [facility]  . rosuvastatin (CRESTOR) 10 MG tablet Take 1 tablet (10 mg total) by mouth daily.   Marland Kitchen. amLODipine (NORVASC) 5 MG tablet Take 1 tablet (5 mg total) by mouth daily.   Marland Kitchen. doxycycline (VIBRA-TABS) 100 MG tablet Take 1 tablet (100 mg total) by mouth 2 (two) times daily.    No facility-administered encounter medications on file as of 09/17/2017.     No Known Allergies  Review of Systems  Constitutional: Negative for chills, fever and malaise/fatigue.  HENT: Positive for congestion, sore throat (slight in the early morning) and tinnitus (chronic). Negative for ear discharge, ear pain, hearing loss, nosebleeds and sinus pain.   Respiratory: Positive for cough and sputum production. Negative for hemoptysis, shortness of breath and wheezing.   Cardiovascular: Negative for chest pain, palpitations, orthopnea and leg  swelling.  Neurological: Negative for weakness.    Objective:  BP (!) 152/98 (BP Location: Right Arm, Patient Position: Sitting, Cuff Size: Normal)   Pulse 80   Temp 98.2 F (36.8 C) (Oral)   Resp 14   SpO2 99%   Physical Exam  Constitutional: He is oriented to person, place, and time and well-developed, well-nourished, and in no distress.  HENT:  Head: Normocephalic and atraumatic.  Right Ear: External ear normal.  Left Ear: External ear normal.  Nose: Nose normal.  Mouth/Throat: Oropharynx is clear and moist.  Eyes: Conjunctivae and EOM are  normal. Pupils are equal, round, and reactive to light.  Neck: Normal range of motion. Neck supple.  Cardiovascular: Normal rate, regular rhythm, normal heart sounds and intact distal pulses.  Pulmonary/Chest: Effort normal and breath sounds normal.  Musculoskeletal: Normal range of motion.  Neurological: He is alert and oriented to person, place, and time. He has normal reflexes. Gait normal. GCS score is 15.  Skin: Skin is warm and dry.  Psychiatric: Mood, memory, affect and judgment normal.    Assessment and Plan :  Bronchitis - Plan: doxycycline (VIBRA-TABS) 100 MG tablet  Essential hypertension - Plan: amLODipine (NORVASC) 5 MG tablet

## 2017-10-15 ENCOUNTER — Other Ambulatory Visit: Payer: Self-pay

## 2017-10-15 ENCOUNTER — Ambulatory Visit: Payer: Medicare Other | Admitting: Family Medicine

## 2017-10-15 ENCOUNTER — Encounter: Payer: Self-pay | Admitting: Family Medicine

## 2017-10-15 VITALS — BP 128/84 | HR 70 | Temp 97.9°F | Resp 16

## 2017-10-15 DIAGNOSIS — M1611 Unilateral primary osteoarthritis, right hip: Secondary | ICD-10-CM | POA: Diagnosis not present

## 2017-10-15 DIAGNOSIS — E7800 Pure hypercholesterolemia, unspecified: Secondary | ICD-10-CM | POA: Diagnosis not present

## 2017-10-15 DIAGNOSIS — I10 Essential (primary) hypertension: Secondary | ICD-10-CM

## 2017-10-15 NOTE — Progress Notes (Signed)
Hawk K. Fullbright  MRN: [id number] DOB: 1951/01/15  Subjective:  HPI   The patient is a 67 year old male who presents for follow up of his blood pressure.  He was last seen on 09/17/17.  At that time he was started on Amlodipine.  He states he has been checking his blood pressure at home and after the first week or so it started to come down and is running around 130's/80's most of the time.  He denies any adverse effects of the medicine  Patient Active Problem List   Diagnosis Date Noted  . Pain in right hip 07/31/2017  . Unilateral primary osteoarthritis, right hip 07/31/2017  . Allergic rhinitis 12/27/2014  . Personal history of disease of skin and subcutaneous tissue 12/27/2014  . History of colon polyps 12/27/2014  . HLD (hyperlipidemia) 12/27/2014  . Arthritis, degenerative 12/27/2014    No past medical history on file.  Social History   Socioeconomic History  . Marital status: Married    Spouse name: Not on file  . Number of children: 3  . Years of education: college  . Highest education level: Not on file  Social Needs  . Financial resource strain: Not on file  . Food insecurity - worry: Not on file  . Food insecurity - inability: Not on file  . Transportation needs - medical: Not on file  . Transportation needs - non-medical: Not on file  Occupational History  . Occupation: retired  Tobacco Use  . Smoking status: Never Smoker  . Smokeless tobacco: Never Used  Substance and Sexual Activity  . Alcohol use: Yes    Alcohol/week: 0.0 oz    Comment: maybe 3 drinks a month  . Drug use: No  . Sexual activity: Yes    Birth control/protection: None  Other Topics Concern  . Not on file  Social History Narrative  . Not on file    Outpatient Encounter Medications as of 10/15/2017  Medication Sig Note  . amLODipine (NORVASC) 5 MG tablet Take 1 tablet (5 mg total) by mouth daily.   Marland Kitchen aspirin 81 MG tablet Take by mouth. 12/27/2014: Received from: [facility]  . MULTIPLE VITAMIN PO Take by mouth. 12/27/2014: Received from: [facility]  . Omega-3 Fatty Acids (FISH OIL) 1200 MG CAPS Take by mouth. 12/27/2014: Received from: [facility]  . rosuvastatin (CRESTOR) 10 MG tablet Take 1 tablet (10 mg total) by mouth daily.   . [DISCONTINUED] doxycycline (VIBRA-TABS) 100 MG tablet Take 1 tablet (100 mg total) by mouth 2 (two) times daily.   . diflunisal (DOLOBID) 500 MG TABS tablet Take 1 tablet (500 mg total) by mouth 2 (two) times daily as needed. (Patient not taking: Reported on 10/15/2017)   . ibuprofen (ADVIL,MOTRIN) 200 MG tablet Take by mouth. 12/28/2014: Received [facility]: [facility]   No facility-administered encounter medications on file as of 10/15/2017.     No Known Allergies  Review of Systems  Constitutional: Negative for fever and malaise/fatigue.  Eyes: Negative.   Respiratory: Negative for cough, shortness of breath and wheezing.   Cardiovascular: Negative for chest pain, palpitations, orthopnea, claudication and leg swelling.  Gastrointestinal: Negative.   Skin: Negative.   Neurological: Negative for weakness.  Endo/Heme/Allergies: Negative.   Psychiatric/Behavioral: Negative.     Objective:  BP 128/84 (BP Location: Right Arm, Patient Position: Sitting, Cuff Size: Normal)   Pulse 70   Temp 97.9 F (36.6 C) (Oral)   Resp 16  Physical Exam  Constitutional: He is oriented to person, place, and time and well-developed, well-nourished, and in no distress.  HENT:  Head: Normocephalic and atraumatic.  Eyes: Conjunctivae are normal. No scleral icterus.  Neck: No thyromegaly present.  Cardiovascular: Normal rate, regular rhythm and normal heart sounds.  Pulmonary/Chest: Effort normal and breath sounds normal.  Abdominal: Soft.  Musculoskeletal: He exhibits edema.  Trace edema.  Neurological: He is alert and oriented to person, place, and time.  Skin: Skin is warm  and dry.  Psychiatric: Mood, memory, affect and judgment normal.    Assessment and Plan :  HTN Check BP at home--improving per pt. CPE in fall. HLD OA  I have done the exam and reviewed the chart and it is accurate to the best of my knowledge. DentistDragon  technology has been used and  any errors in dictation or transcription are unintentional. Julieanne Mansonichard Kriste Broman M.D. [facility]

## 2017-11-05 ENCOUNTER — Telehealth: Payer: Self-pay

## 2017-11-05 NOTE — Telephone Encounter (Signed)
yes

## 2017-11-05 NOTE — Telephone Encounter (Signed)
Patient wants to know if Dr. Sullivan LoneGilbert will establish his son as a new patient. He states he spoke with Dr. Sullivan LoneGilbert about this a while back and he was okay with establishing him at that time. CB# [phone]

## 2017-11-05 NOTE — Telephone Encounter (Signed)
Please advise. Thanks.  

## 2017-11-06 NOTE — Telephone Encounter (Signed)
Patient advised to have son call to make the appointment.   Thanks

## 2017-12-26 ENCOUNTER — Telehealth: Payer: Self-pay

## 2017-12-26 NOTE — Telephone Encounter (Signed)
Called pt to schedule AWV prior to CPE on 01/13/18. Only availablility for wellness was on 01/09/18. Pt declined AWV this year and states he will have this completed next year. -MM

## 2018-01-13 ENCOUNTER — Encounter: Payer: Self-pay | Admitting: Family Medicine

## 2018-01-13 ENCOUNTER — Other Ambulatory Visit: Payer: Self-pay

## 2018-01-13 MED ORDER — ROSUVASTATIN CALCIUM 10 MG PO TABS: 10.0000 mg | ORAL_TABLET | Freq: Every day | ORAL | 3 refills | Status: DC

## 2018-01-22 ENCOUNTER — Ambulatory Visit (INDEPENDENT_AMBULATORY_CARE_PROVIDER_SITE_OTHER): Payer: Medicare Other

## 2018-01-22 VITALS — BP 128/78 | HR 69 | Temp 99.1°F | Ht 71.0 in | Wt 220.4 lb

## 2018-01-22 DIAGNOSIS — Z0000 Encounter for general adult medical examination without abnormal findings: Secondary | ICD-10-CM

## 2018-01-22 NOTE — Patient Instructions (Addendum)
Mr. Gabriel Mccormick , Thank you for taking time to come for your Medicare Wellness Visit. I appreciate your ongoing commitment to your health goals. Please review the following plan we discussed and let me know if I can assist you in the future.   Screening recommendations/referrals: Colonoscopy: Up to date Recommended yearly ophthalmology/optometry visit for glaucoma screening and checkup Recommended yearly dental visit for hygiene and checkup  Vaccinations: Influenza vaccine: N/A Pneumococcal vaccine: Pt declines today.  Tdap vaccine: Up to date Shingles vaccine: Pt declines today.     Advanced directives: Please bring a copy of your POA (Power of Attorney) and/or Living Will to your next appointment.   Conditions/risks identified: Recommend increasing water intake to 4 glasses a day.   Next appointment: 02/02/18 @ 9 AM with Dr Sullivan LoneGilbert.   Preventive Care 6757 Years and Older, Male Preventive care refers to lifestyle choices and visits with your health care provider that can promote health and wellness. What does preventive care include?  A yearly physical exam. This is also called an annual well check.  Dental exams once or twice a year.  Routine eye exams. Ask your health care provider how often you should have your eyes checked.  Personal lifestyle choices, including:  Daily care of your teeth and gums.  Regular physical activity.  Eating a healthy diet.  Avoiding tobacco and drug use.  Limiting alcohol use.  Practicing safe sex.  Taking low doses of aspirin every day.  Taking vitamin and mineral supplements as recommended by your health care provider. What happens during an annual well check? The services and screenings done by your health care provider during your annual well check will depend on your age, overall health, lifestyle risk factors, and family history of disease. Counseling  Your health care provider may ask you questions about your:  Alcohol  use.  Tobacco use.  Drug use.  Emotional well-being.  Home and relationship well-being.  Sexual activity.  Eating habits.  History of falls.  Memory and ability to understand (cognition).  Work and work Astronomerenvironment. Screening  You may have the following tests or measurements:  Height, weight, and BMI.  Blood pressure.  Lipid and cholesterol levels. These may be checked every 5 years, or more frequently if you are over 67 years old.  Skin check.  Lung cancer screening. You may have this screening every year starting at age 67 if you have a 30-pack-year history of smoking and currently smoke or have quit within the past 15 years.  Fecal occult blood test (FOBT) of the stool. You may have this test every year starting at age 67.  Flexible sigmoidoscopy or colonoscopy. You may have a sigmoidoscopy every 5 years or a colonoscopy every 10 years starting at age 67.  Prostate cancer screening. Recommendations will vary depending on your family history and other risks.  Hepatitis C blood test.  Hepatitis B blood test.  Sexually transmitted disease (STD) testing.  Diabetes screening. This is done by checking your blood sugar (glucose) after you have not eaten for a while (fasting). You may have this done every 1-3 years.  Abdominal aortic aneurysm (AAA) screening. You may need this if you are a current or former smoker.  Osteoporosis. You may be screened starting at age 67 if you are at high risk. Talk with your health care provider about your test results, treatment options, and if necessary, the need for more tests. Vaccines  Your health care provider may recommend certain vaccines, such as:  Influenza vaccine. This is recommended every year.  Tetanus, diphtheria, and acellular pertussis (Tdap, Td) vaccine. You may need a Td booster every 10 years.  Zoster vaccine. You may need this after age 67.  Pneumococcal 13-valent conjugate (PCV13) vaccine. One dose is  recommended after age 67.  Pneumococcal polysaccharide (PPSV23) vaccine. One dose is recommended after age 67. Talk to your health care provider about which screenings and vaccines you need and how often you need them. This information is not intended to replace advice given to you by your health care provider. Make sure you discuss any questions you have with your health care provider. Document Released: 08/11/2015 Document Revised: 04/03/2016 Document Reviewed: 05/16/2015 Elsevier Interactive Patient Education  2017 [place] Prevention in the Home Falls can cause injuries. They can happen to people of all ages. There are many things you can do to make your home safe and to help prevent falls. What can I do on the outside of my home?  Regularly fix the edges of walkways and driveways and fix any cracks.  Remove anything that might make you trip as you walk through a door, such as a raised step or threshold.  Trim any bushes or trees on the path to your home.  Use bright outdoor lighting.  Clear any walking paths of anything that might make someone trip, such as rocks or tools.  Regularly check to see if handrails are loose or broken. Make sure that both sides of any steps have handrails.  Any raised decks and porches should have guardrails on the edges.  Have any leaves, snow, or ice cleared regularly.  Use sand or salt on walking paths during winter.  Clean up any spills in your garage right away. This includes oil or grease spills. What can I do in the bathroom?  Use night lights.  Install grab bars by the toilet and in the tub and shower. Do not use towel bars as grab bars.  Use non-skid mats or decals in the tub or shower.  If you need to sit down in the shower, use a plastic, non-slip stool.  Keep the floor dry. Clean up any water that spills on the floor as soon as it happens.  Remove soap buildup in the tub or shower regularly.  Attach bath mats  securely with double-sided non-slip rug tape.  Do not have throw rugs and other things on the floor that can make you trip. What can I do in the bedroom?  Use night lights.  Make sure that you have a light by your bed that is easy to reach.  Do not use any sheets or blankets that are too big for your bed. They should not hang down onto the floor.  Have a firm chair that has side arms. You can use this for support while you get dressed.  Do not have throw rugs and other things on the floor that can make you trip. What can I do in the kitchen?  Clean up any spills right away.  Avoid walking on wet floors.  Keep items that you use a lot in easy-to-reach places.  If you need to reach something above you, use a strong step stool that has a grab bar.  Keep electrical cords out of the way.  Do not use floor polish or wax that makes floors slippery. If you must use wax, use non-skid floor wax.  Do not have throw rugs and other things on the floor that  can make you trip. What can I do with my stairs?  Do not leave any items on the stairs.  Make sure that there are handrails on both sides of the stairs and use them. Fix handrails that are broken or loose. Make sure that handrails are as long as the stairways.  Check any carpeting to make sure that it is firmly attached to the stairs. Fix any carpet that is loose or worn.  Avoid having throw rugs at the top or bottom of the stairs. If you do have throw rugs, attach them to the floor with carpet tape.  Make sure that you have a light switch at the top of the stairs and the bottom of the stairs. If you do not have them, ask someone to add them for you. What else can I do to help prevent falls?  Wear shoes that:  Do not have high heels.  Have rubber bottoms.  Are comfortable and fit you well.  Are closed at the toe. Do not wear sandals.  If you use a stepladder:  Make sure that it is fully opened. Do not climb a closed  stepladder.  Make sure that both sides of the stepladder are locked into place.  Ask someone to hold it for you, if possible.  Clearly mark and make sure that you can see:  Any grab bars or handrails.  First and last steps.  Where the edge of each step is.  Use tools that help you move around (mobility aids) if they are needed. These include:  Canes.  Walkers.  Scooters.  Crutches.  Turn on the lights when you go into a dark area. Replace any light bulbs as soon as they burn out.  Set up your furniture so you have a clear path. Avoid moving your furniture around.  If any of your floors are uneven, fix them.  If there are any pets around you, be aware of where they are.  Review your medicines with your doctor. Some medicines can make you feel dizzy. This can increase your chance of falling. Ask your doctor what other things that you can do to help prevent falls. This information is not intended to replace advice given to you by your health care provider. Make sure you discuss any questions you have with your health care provider. Document Released: 05/11/2009 Document Revised: 12/21/2015 Document Reviewed: 08/19/2014 Elsevier Interactive Patient Education  2017 [employer].

## 2018-01-22 NOTE — Progress Notes (Signed)
Subjective:   Gabriel Mccormick is a 67 y.o. male who presents for an Initial Medicare Annual Wellness Visit.  Review of Systems  N/A  Cardiac Risk Factors include: advanced age (>33men, >42 women);dyslipidemia;hypertension;male gender;obesity (BMI >30kg/m2)    Objective:    Today's Vitals   01/22/18 1403  BP: 128/78  Pulse: 69  Temp: 99.1 F (37.3 C)  TempSrc: Oral  Weight: 220 lb 6.4 oz (100 kg)  Height: 5\' 11"  (1.803 m)  PainSc: 0-No pain   Body mass index is 30.74 kg/m.  Advanced Directives 01/22/2018 01/02/2016 12/28/2014  Does Patient Have a Medical Advance Directive? Yes No No  Type of Advance Directive Living will - -  Would patient like information on creating a medical advance directive? - - No - patient declined information    Current Medications (verified) Outpatient Encounter Medications as of 01/22/2018  Medication Sig  . amLODipine (NORVASC) 5 MG tablet Take 1 tablet (5 mg total) by mouth daily.  Marland Kitchen aspirin 81 MG tablet Take 81 mg by mouth daily.   . diflunisal (DOLOBID) 500 MG TABS tablet Take 1 tablet (500 mg total) by mouth 2 (two) times daily as needed.  Marland Kitchen ibuprofen (ADVIL,MOTRIN) 200 MG tablet Take 400 mg by mouth every 6 (six) hours as needed.   . MULTIPLE VITAMIN PO Take by mouth daily.   . Omega-3 Fatty Acids (FISH OIL) 1200 MG CAPS Take by mouth daily.   . rosuvastatin (CRESTOR) 10 MG tablet Take 1 tablet (10 mg total) by mouth daily.   No facility-administered encounter medications on file as of 01/22/2018.     Allergies (verified) Patient has no known allergies.   History: Past Medical History:  Diagnosis Date  . Hyperlipidemia    Past Surgical History:  Procedure Laterality Date  . HERNIA REPAIR    . MANDIBLE SURGERY     asymmetry corrected   Family History  Problem Relation Age of Onset  . GER disease Mother   . Dementia Mother   . Lung cancer Father   . Cancer Maternal Grandfather   . Diabetes Paternal Grandmother   . Heart  attack Paternal Grandmother   . Leukemia Paternal Grandfather   . Diabetes Maternal Grandmother    Social History   Socioeconomic History  . Marital status: Married    Spouse name: Not on file  . Number of children: 3  . Years of education: college  . Highest education level: Bachelor's degree (e.g., BA, AB, BS)  Occupational History  . Occupation: retired  . Occupation: part time work for Human resources officer  Social Needs  . Financial resource strain: Not hard at all  . Food insecurity:    Worry: Never true    Inability: Never true  . Transportation needs:    Medical: No    Non-medical: No  Tobacco Use  . Smoking status: Never Smoker  . Smokeless tobacco: Never Used  Substance and Sexual Activity  . Alcohol use: Yes    Alcohol/week: 0.0 oz    Comment: maybe 3 drinks a month  . Drug use: No  . Sexual activity: Yes    Birth control/protection: None  Lifestyle  . Physical activity:    Days per week: Not on file    Minutes per session: Not on file  . Stress: Not at all  Relationships  . Social connections:    Talks on phone: Not on file    Gets together: Not on file    Attends religious service:  Not on file    Active member of club or organization: Not on file    Attends meetings of clubs or organizations: Not on file    Relationship status: Not on file  Other Topics Concern  . Not on file  Social History Narrative  . Not on file   Tobacco Counseling Counseling given: Not Answered   Clinical Intake:  Pre-visit preparation completed: Yes  Pain : No/denies pain Pain Score: 0-No pain     Nutritional Status: BMI > 30  Obese Nutritional Risks: None Diabetes: No  How often do you need to have someone help you when you read instructions, pamphlets, or other written materials from your doctor or pharmacy?: 1 - Never  Interpreter Needed?: No  Information entered by :: [facility], LPN  Activities of Daily Living In your present state of health, do you have  any difficulty performing the following activities: 01/22/2018  Hearing? N  Vision? N  Difficulty concentrating or making decisions? N  Walking or climbing stairs? N  Dressing or bathing? N  Doing errands, shopping? N  Preparing Food and eating ? N  Using the Toilet? N  In the past six months, have you accidently leaked urine? N  Do you have problems with loss of bowel control? N  Managing your Medications? N  Managing your Finances? N  Housekeeping or managing your Housekeeping? N  Some recent data might be hidden     Immunizations and Health Maintenance Immunization History  Administered Date(s) Administered  . Tdap 12/01/2008  . Zoster 12/12/2011   Health Maintenance Due  Topic Date Due  . PNA vac Low Risk Adult (1 of 2 - PCV13) 06/08/2016    Patient Care Team: Maple Hudson., MD as PCP - General (Family Medicine) Kathryne Hitch, MD as Consulting Physician (Orthopedic Surgery)  Indicate any recent Medical Services you may have received from other than [facility] providers in the past year (date may be approximate).    Assessment:   This is a routine wellness examination for Gabriel Mccormick.  Hearing/Vision screen No exam data present  Dietary issues and exercise activities discussed: Current Exercise Habits: The patient does not participate in regular exercise at present, Exercise limited by: orthopedic condition(s)(hip pain- having sx in the future)  Goals    . DIET - INCREASE WATER INTAKE     Recommend increasing water intake to 4 glasses a day.       Depression Screen PHQ 2/9 Scores 01/22/2018 01/22/2018 01/08/2017 01/08/2017  PHQ - 2 Score 0 0 0 0  PHQ- 9 Score 0 - - 0    Fall Risk Fall Risk  01/22/2018 01/08/2017 01/02/2016  Falls in the past year? No No No    Is the patient's home free of loose throw rugs in walkways, pet beds, electrical cords, etc?   yes      Grab bars in the bathroom? yes      Handrails on the stairs?   yes      Adequate lighting?    yes  Timed Get Up and Go performed: N/A  Cognitive Function: Pt declined screening today.         Screening Tests Health Maintenance  Topic Date Due  . PNA vac Low Risk Adult (1 of 2 - PCV13) 06/08/2016  . INFLUENZA VACCINE  02/26/2018  . TETANUS/TDAP  12/02/2018  . COLONOSCOPY  04/06/2019  . Hepatitis C Screening  Completed    Qualifies for Shingles Vaccine? Due for Shingles vaccine. Declined  my offer to administer today. Education has been provided regarding the importance of this vaccine. Pt has been advised to call her insurance company to determine her out of pocket expense. Advised she may also receive this vaccine at her local pharmacy or Health Dept. Verbalized acceptance and understanding.  Cancer Screenings: Lung: Low Dose CT Chest recommended if Age 37-80 years, 30 pack-year currently smoking OR have quit w/in 15years. Patient does not qualify. Colorectal: Up to date  Additional Screenings:  Hepatitis C Screening: Up to date      Plan:  I have personally reviewed and addressed the Medicare Annual Wellness questionnaire and have noted the following in the patient's chart:  A. Medical and social history B. Use of alcohol, tobacco or illicit drugs  C. Current medications and supplements D. Functional ability and status E.  Nutritional status F.  Physical activity G. Advance directives H. List of other physicians I.  Hospitalizations, surgeries, and ER visits in previous 12 months J.  Vitals K. Screenings such as hearing and vision if needed, cognitive and depression L. Referrals and appointments - none  In addition, I have reviewed and discussed with patient certain preventive protocols, quality metrics, and best practice recommendations. A written personalized care plan for preventive services as well as general preventive health recommendations were provided to patient.  See attached scanned questionnaire for additional information.   Signed,  Hyacinth MeekerMckenzie  Allison Deshotels, LPN Nurse Health Advisor   Nurse Recommendations: Pt declined the Prevnar 13 vaccine today. Pt would like to wait until next OV on 02/02/18 to receive this.

## 2018-02-02 ENCOUNTER — Encounter: Payer: Self-pay | Admitting: Family Medicine

## 2018-02-02 ENCOUNTER — Ambulatory Visit (INDEPENDENT_AMBULATORY_CARE_PROVIDER_SITE_OTHER): Payer: Medicare Other | Admitting: Family Medicine

## 2018-02-02 VITALS — BP 126/84 | HR 68 | Temp 98.8°F | Resp 16 | Ht 71.0 in | Wt 215.0 lb

## 2018-02-02 DIAGNOSIS — Z0000 Encounter for general adult medical examination without abnormal findings: Secondary | ICD-10-CM

## 2018-02-02 DIAGNOSIS — M2550 Pain in unspecified joint: Secondary | ICD-10-CM | POA: Diagnosis not present

## 2018-02-02 DIAGNOSIS — E7800 Pure hypercholesterolemia, unspecified: Secondary | ICD-10-CM | POA: Diagnosis not present

## 2018-02-02 DIAGNOSIS — Z0001 Encounter for general adult medical examination with abnormal findings: Secondary | ICD-10-CM

## 2018-02-02 DIAGNOSIS — I10 Essential (primary) hypertension: Secondary | ICD-10-CM | POA: Diagnosis not present

## 2018-02-02 DIAGNOSIS — N400 Enlarged prostate without lower urinary tract symptoms: Secondary | ICD-10-CM

## 2018-02-02 NOTE — Progress Notes (Signed)
Patient: Gabriel Mccormick. Jeff, Male    DOB: 15-Aug-1950, 67 y.o.   MRN: [id number] Visit Date: 02/02/2018  Today's Provider: Megan Mans, MD   Chief Complaint  Patient presents with  . Annual Wellness Exam   Subjective:  Patient had AWV on 01/22/2018.    Annual physical exam Federico K. Frenette is a 67 y.o. male who presents today for health maintenance and complete physical. He feels well. He reports exercising occasionally. He reports he is sleeping well. He is retired married father of 3 who takes care of his wife who has some significant health issues.   Colonoscopy- 07/06/2014. Repeat in 5 years. Internal hemorrhoids otherwise normal.  Tdap- 12/01/2008    Review of Systems  Constitutional: Negative.   HENT: Negative.   Eyes: Negative.   Respiratory: Negative.   Cardiovascular: Positive for leg swelling. Negative for chest pain and palpitations.       Mainly at night. Reports that this started when he started amlodipine.   Gastrointestinal: Negative.   Endocrine: Negative.   Genitourinary: Negative.   Musculoskeletal: Negative.   Skin: Negative.   Allergic/Immunologic: Negative.   Neurological: Negative.   Hematological: Negative.   Psychiatric/Behavioral: Negative.     Social History      He  reports that he has never smoked. He has never used smokeless tobacco. He reports that he drinks alcohol. He reports that he does not use drugs.       Social History   Socioeconomic History  . Marital status: Married    Spouse name: Not on file  . Number of children: 3  . Years of education: college  . Highest education level: Bachelor's degree (e.g., BA, AB, BS)  Occupational History  . Occupation: retired  . Occupation: part time work for Human resources officer  Social Needs  . Financial resource strain: Not hard at all  . Food insecurity:    Worry: Never true    Inability: Never true  . Transportation needs:    Medical: No    Non-medical: No  Tobacco Use  .  Smoking status: Never Smoker  . Smokeless tobacco: Never Used  Substance and Sexual Activity  . Alcohol use: Yes    Alcohol/week: 0.0 oz    Comment: maybe 3 drinks a month  . Drug use: No  . Sexual activity: Yes    Birth control/protection: None  Lifestyle  . Physical activity:    Days per week: Not on file    Minutes per session: Not on file  . Stress: Not at all  Relationships  . Social connections:    Talks on phone: Not on file    Gets together: Not on file    Attends religious service: Not on file    Active member of club or organization: Not on file    Attends meetings of clubs or organizations: Not on file    Relationship status: Not on file  Other Topics Concern  . Not on file  Social History Narrative  . Not on file    Past Medical History:  Diagnosis Date  . Hyperlipidemia      Patient Active Problem List   Diagnosis Date Noted  . Pain in right hip 07/31/2017  . Unilateral primary osteoarthritis, right hip 07/31/2017  . Allergic rhinitis 12/27/2014  . Personal history of disease of skin and subcutaneous tissue 12/27/2014  . History of colon polyps 12/27/2014  . HLD (hyperlipidemia) 12/27/2014  . Arthritis, degenerative 12/27/2014  Past Surgical History:  Procedure Laterality Date  . HERNIA REPAIR    . MANDIBLE SURGERY     asymmetry corrected    Family History        Family Status  Relation Name Status  . Mother  Deceased at age 39       due to complications from pneumonia  . Father  Deceased at age 84       lung cancer, hx tobacco use  . MGF  Deceased  . PGM  Deceased  . PGF  Deceased  . Sister  Alive  . Daughter  Alive  . Son  Alive  . Son  Alive  . MGM  (Not Specified)        His family history includes Cancer in his maternal grandfather; Dementia in his mother; Diabetes in his maternal grandmother and paternal grandmother; GER disease in his mother; Heart attack in his paternal grandmother; Leukemia in his paternal grandfather; Lung  cancer in his father.      No Known Allergies   Current Outpatient Medications:  .  amLODipine (NORVASC) 5 MG tablet, Take 1 tablet (5 mg total) by mouth daily., Disp: 90 tablet, Rfl: 3 .  aspirin 81 MG tablet, Take 81 mg by mouth daily. , Disp: , Rfl:  .  diflunisal (DOLOBID) 500 MG TABS tablet, Take 1 tablet (500 mg total) by mouth 2 (two) times daily as needed., Disp: 60 tablet, Rfl: 2 .  ibuprofen (ADVIL,MOTRIN) 200 MG tablet, Take 400 mg by mouth every 6 (six) hours as needed. , Disp: , Rfl:  .  MULTIPLE VITAMIN PO, Take by mouth daily. , Disp: , Rfl:  .  Omega-3 Fatty Acids (FISH OIL) 1200 MG CAPS, Take by mouth daily. , Disp: , Rfl:  .  rosuvastatin (CRESTOR) 10 MG tablet, Take 1 tablet (10 mg total) by mouth daily., Disp: 90 tablet, Rfl: 3   Patient Care Team: Maple Hudson., MD as PCP - General (Family Medicine) Kathryne Hitch, MD as Consulting Physician (Orthopedic Surgery)      Objective:   Vitals: BP 126/84 (BP Location: Left Arm, Patient Position: Sitting, Cuff Size: Large)   Pulse 68   Temp 98.8 F (37.1 C)   Resp 16   Ht 5\' 11"  (1.803 m)   Wt 215 lb (97.5 kg)   SpO2 97%   BMI 29.99 kg/m    Vitals:   02/02/18 0900  BP: 126/84  Pulse: 68  Resp: 16  Temp: 98.8 F (37.1 C)  SpO2: 97%  Weight: 215 lb (97.5 kg)  Height: 5\' 11"  (1.803 m)     Physical Exam  Constitutional: He is oriented to person, place, and time. He appears well-developed and well-nourished.  HENT:  Head: Normocephalic and atraumatic.  Right Ear: External ear normal.  Left Ear: External ear normal.  Nose: Nose normal.  Mouth/Throat: Oropharynx is clear and moist.  Eyes: Pupils are equal, round, and reactive to light. Conjunctivae are normal. No scleral icterus.  Neck: Neck supple. No thyromegaly present.  Cardiovascular: Normal rate, regular rhythm, normal heart sounds and intact distal pulses.  Pulmonary/Chest: Effort normal and breath sounds normal.  Abdominal:  Soft.  Musculoskeletal: He exhibits no edema.  Lymphadenopathy:    He has no cervical adenopathy.  Neurological: He is alert and oriented to person, place, and time.  Skin: Skin is warm and dry.  Psychiatric: He has a normal mood and affect. His behavior is normal. Judgment and thought content normal.  Depression Screen PHQ 2/9 Scores 01/22/2018 01/22/2018 01/08/2017 01/08/2017  PHQ - 2 Score 0 0 0 0  PHQ- 9 Score 0 - - 0   Cognitive Testing - 6-CIT Pt. Declined screening on 01/22/2018.       Assessment & Plan:     Routine Health Maintenance and Physical Exam  Exercise Activities and Dietary recommendations Goals    . DIET - INCREASE WATER INTAKE     Recommend increasing water intake to 4 glasses a day.        Immunization History  Administered Date(s) Administered  . Tdap 12/01/2008  . Zoster 12/12/2011    Health Maintenance  Topic Date Due  . PNA vac Low Risk Adult (1 of 2 - PCV13) 06/08/2016  . INFLUENZA VACCINE  02/26/2018  . TETANUS/TDAP  12/02/2018  . COLONOSCOPY  04/06/2019  . Hepatitis C Screening  Completed     Discussed health benefits of physical activity, and encouraged him to engage in regular exercise appropriate for his age and condition.  Right Hip Pain/OA Per ortho. BPH  Check PSA. HLD      I have done the exam and reviewed the above chart and it is accurate to the best of my knowledge. [employer]  technology has been used in this note in any air is in the dictation or transcription are unintentional.  Megan Mansichard Uzoma Vivona Jr, MD  [facility]

## 2018-02-03 LAB — CBC WITH DIFFERENTIAL/PLATELET
Basophils Absolute: 0 10*3/uL (ref 0.0–0.2)
Basos: 1 %
EOS (ABSOLUTE): 0.1 10*3/uL (ref 0.0–0.4)
EOS: 1 %
HEMATOCRIT: 46.4 % (ref 37.5–51.0)
HEMOGLOBIN: 16.3 g/dL (ref 13.0–17.7)
IMMATURE GRANULOCYTES: 0 %
Immature Grans (Abs): 0 10*3/uL (ref 0.0–0.1)
Lymphocytes Absolute: 1.5 10*3/uL (ref 0.7–3.1)
Lymphs: 21 %
MCH: 31.5 pg (ref 26.6–33.0)
MCHC: 35.1 g/dL (ref 31.5–35.7)
MCV: 90 fL (ref 79–97)
MONOCYTES: 10 %
Monocytes Absolute: 0.7 10*3/uL (ref 0.1–0.9)
NEUTROS PCT: 67 %
Neutrophils Absolute: 4.8 10*3/uL (ref 1.4–7.0)
Platelets: 231 10*3/uL (ref 150–450)
RBC: 5.18 x10E6/uL (ref 4.14–5.80)
RDW: 13.4 % (ref 12.3–15.4)
WBC: 7.2 10*3/uL (ref 3.4–10.8)

## 2018-02-03 LAB — COMPREHENSIVE METABOLIC PANEL
ALBUMIN: 4.9 g/dL — AB (ref 3.6–4.8)
ALT: 15 IU/L (ref 0–44)
AST: 21 IU/L (ref 0–40)
Albumin/Globulin Ratio: 1.8 (ref 1.2–2.2)
Alkaline Phosphatase: 56 IU/L (ref 39–117)
BUN/Creatinine Ratio: 19 (ref 10–24)
BUN: 21 mg/dL (ref 8–27)
Bilirubin Total: 0.9 mg/dL (ref 0.0–1.2)
CALCIUM: 10 mg/dL (ref 8.6–10.2)
CO2: 23 mmol/L (ref 20–29)
CREATININE: 1.11 mg/dL (ref 0.76–1.27)
Chloride: 96 mmol/L (ref 96–106)
GFR calc Af Amer: 80 mL/min/{1.73_m2} (ref >59)
GFR, EST NON AFRICAN AMERICAN: 69 mL/min/{1.73_m2} (ref >59)
Globulin, Total: 2.7 g/dL (ref 1.5–4.5)
Glucose: 105 mg/dL — ABNORMAL HIGH (ref 65–99)
Potassium: 4.1 mmol/L (ref 3.5–5.2)
Sodium: 139 mmol/L (ref 134–144)
Total Protein: 7.6 g/dL (ref 6.0–8.5)

## 2018-02-03 LAB — LIPID PANEL
CHOL/HDL RATIO: 4.1 ratio (ref 0.0–5.0)
Cholesterol, Total: 200 mg/dL — ABNORMAL HIGH (ref 100–199)
HDL: 49 mg/dL (ref >39)
LDL CALC: 132 mg/dL — AB (ref 0–99)
TRIGLYCERIDES: 95 mg/dL (ref 0–149)
VLDL CHOLESTEROL CAL: 19 mg/dL (ref 5–40)

## 2018-02-03 LAB — TSH: TSH: 2.55 u[IU]/mL (ref 0.450–4.500)

## 2018-02-03 LAB — PSA: Prostate Specific Ag, Serum: 1.6 ng/mL (ref 0.0–4.0)

## 2018-06-08 ENCOUNTER — Ambulatory Visit: Payer: Self-pay | Admitting: Family Medicine

## 2018-06-11 ENCOUNTER — Ambulatory Visit: Payer: Medicare Other | Admitting: Family Medicine

## 2018-06-11 VITALS — BP 122/80 | HR 67 | Temp 98.1°F | Resp 16 | Wt 215.0 lb

## 2018-06-11 DIAGNOSIS — I10 Essential (primary) hypertension: Secondary | ICD-10-CM

## 2018-06-11 DIAGNOSIS — Z23 Encounter for immunization: Secondary | ICD-10-CM | POA: Diagnosis not present

## 2018-06-11 DIAGNOSIS — E7800 Pure hypercholesterolemia, unspecified: Secondary | ICD-10-CM | POA: Diagnosis not present

## 2018-06-11 NOTE — Progress Notes (Signed)
Gabriel Mccormick  MRN: [id number] DOB: October 12, 1950  Subjective:  HPI   The patient is a 67 year old male who presents for follow up of chronic health.  He was last seen on 02/02/18 for his annual wellness exam.    1. Pure hypercholesterolemia Lab Results  Component Value Date   CHOL 200 (H) 02/02/2018   HDL 49 02/02/2018   LDLCALC 132 (H) 02/02/2018   TRIG 95 02/02/2018   CHOLHDL 4.1 02/02/2018   2. Essential hypertension-patient's blood pressure has been stable and he has not been checking it at home anymore.   BP Readings from Last 3 Encounters:  06/11/18 122/80  02/02/18 126/84  01/22/18 128/78    Patient Active Problem List   Diagnosis Date Noted  . Hypertension 06/11/2018  . Pain in right hip 07/31/2017  . Unilateral primary osteoarthritis, right hip 07/31/2017  . Allergic rhinitis 12/27/2014  . Personal history of disease of skin and subcutaneous tissue 12/27/2014  . History of colon polyps 12/27/2014  . HLD (hyperlipidemia) 12/27/2014  . Arthritis, degenerative 12/27/2014    Past Medical History:  Diagnosis Date  . Hyperlipidemia     Social History   Socioeconomic History  . Marital status: Married    Spouse name: Not on file  . Number of children: 3  . Years of education: college  . Highest education level: Bachelor's degree (e.g., BA, AB, BS)  Occupational History  . Occupation: retired  . Occupation: part time work for [employer] officer[employer]  Social Needs  . Financial resource strain: Not hard at all  . Food insecurity:    Worry: Never true    Inability: Never true  . Transportation needs:    Medical: No    Non-medical: No  Tobacco Use  . Smoking status: Never Smoker  . Smokeless tobacco: Never Used  Substance and Sexual Activity  . Alcohol use: Yes    Alcohol/week: 0.0 standard drinks    Comment: maybe 3 drinks a month  . Drug use: No  . Sexual activity: Yes    Birth control/protection: None  Lifestyle  . Physical activity:    Days per  week: Not on file    Minutes per session: Not on file  . Stress: Not at all  Relationships  . Social connections:    Talks on phone: Not on file    Gets together: Not on file    Attends religious service: Not on file    Active member of club or organization: Not on file    Attends meetings of clubs or organizations: Not on file    Relationship status: Not on file  . Intimate partner violence:    Fear of current or ex partner: Not on file    Emotionally abused: Not on file    Physically abused: Not on file    Forced sexual activity: Not on file  Other Topics Concern  . Not on file  Social History Narrative  . Not on file    Outpatient Encounter Medications as of 06/11/2018  Medication Sig Note  . amLODipine (NORVASC) 5 MG tablet Take 1 tablet (5 mg total) by mouth daily.   Marland Kitchen. aspirin 81 MG tablet Take 81 mg by mouth daily.  12/27/2014: [facility]  . diflunisal (DOLOBID) 500 MG TABS tablet Take 1 tablet (500 mg total) by mouth 2 (two) times daily as needed.   Marland Kitchen. ibuprofen (ADVIL,MOTRIN) 200 MG tablet Take 400 mg by mouth every 6 (six) hours  as needed.  12/28/2014: Received from: [facility]  . MULTIPLE VITAMIN PO Take by mouth daily.  12/27/2014: Received from: [facility]  . Omega-3 Fatty Acids (FISH OIL) 1200 MG CAPS Take by mouth daily.  12/27/2014: Received from: [facility]  . rosuvastatin (CRESTOR) 10 MG tablet Take 1 tablet (10 mg total) by mouth daily.    No facility-administered encounter medications on file as of 06/11/2018.     No Known Allergies  Review of Systems  Constitutional: Negative for fever and malaise/fatigue.  HENT: Negative.   Eyes: Negative.   Respiratory: Negative for cough, shortness of breath and wheezing.   Cardiovascular: Negative for chest pain, palpitations, orthopnea, claudication and leg swelling (ankle).  Gastrointestinal: Negative.   Genitourinary: Negative.     Skin: Negative.   Neurological: Negative for dizziness and headaches.  Psychiatric/Behavioral: Negative.     Objective:  BP 122/80 (BP Location: Right Arm, Patient Position: Sitting, Cuff Size: Normal)   Pulse 67   Temp 98.1 F (36.7 C) (Oral)   Resp 16   Wt 215 lb (97.5 kg)   SpO2 98%   BMI 29.99 kg/m   Physical Exam  Constitutional: He is oriented to person, place, and time and well-developed, well-nourished, and in no distress.  HENT:  Head: Normocephalic and atraumatic.  Right Ear: External ear normal.  Left Ear: External ear normal.  Nose: Nose normal.  Eyes: Conjunctivae are normal. No scleral icterus.  Neck: No thyromegaly present.  Cardiovascular: Normal rate, regular rhythm and normal heart sounds.  Pulmonary/Chest: Effort normal and breath sounds normal.  Abdominal: Soft.  Musculoskeletal: He exhibits no edema.  Neurological: He is alert and oriented to person, place, and time. Gait normal. GCS score is 15.  Skin: Skin is warm and dry.  Psychiatric: Mood, memory, affect and judgment normal.    Assessment and Plan :   1. Pure hypercholesterolemia Controlled on Crestor.  2. Essential hypertension .  Well-controlled.  3. Need for influenza vaccination  - Flu vaccine HIGH DOSE PF (Fluzone High dose)  4. Need for vaccination against Streptococcus pneumoniae using pneumococcal conjugate vaccine 13  - Pneumococcal conjugate vaccine 13-valent   HPI, Exam and A&P Transcribed under the direction and in the presence of Megan Mans., MD. Electronically Signed: Janey Greaser, RMA I have done the exam and reviewed the chart and it is accurate to the best of my knowledge. Dentist has been used and  any errors in dictation or transcription are unintentional. Julieanne Manson M.D. [facility]

## 2018-06-12 ENCOUNTER — Other Ambulatory Visit: Payer: Self-pay | Admitting: Family Medicine

## 2018-06-12 DIAGNOSIS — I10 Essential (primary) hypertension: Secondary | ICD-10-CM

## 2018-07-28 ENCOUNTER — Encounter: Payer: Self-pay | Admitting: Family Medicine

## 2018-07-28 ENCOUNTER — Other Ambulatory Visit: Payer: Self-pay

## 2018-07-28 ENCOUNTER — Ambulatory Visit: Payer: Medicare Other | Admitting: Family Medicine

## 2018-07-28 VITALS — BP 132/80 | HR 77 | Temp 98.2°F | Ht 72.0 in | Wt 220.2 lb

## 2018-07-28 DIAGNOSIS — J069 Acute upper respiratory infection, unspecified: Secondary | ICD-10-CM

## 2018-07-28 NOTE — Progress Notes (Signed)
  Subjective:     Patient ID: Gabriel Mccormick, male   DOB: [birth year], 67 y.o.   MRN: [id number] Chief Complaint  Patient presents with  . Cough    greenish yellow mucus been taking an abx his wife had.  since 07/23/18 and seems to be not getting better.  sinus pressure   HPI Reports cold symptoms but does not feel particularly ill or feverish. Has been doing his usual routine without difficulty. Took two days of cefuroxime with little improvement. Instructed him to discontinue.HIs wife has been sick as well.  Review of Systems     Objective:   Physical Exam Constitutional:      General: He is not in acute distress.    Appearance: He is not ill-appearing.   Ears: T.M's intact without inflammation Sinuses: mild maxillary sinus tenderness Throat: tonsils absent Neck: no cervical adenopathy Lungs: clear     Assessment:    1. URI, acute    Plan:    Discussed use of Mucinex D for congestion, Delsym for cough, and Benadryl for postnasal drainage. Will call at the end of the week if sinuses not improving.

## 2018-07-28 NOTE — Patient Instructions (Signed)
Discussed use of Mucinex D for congestion, Delsym for cough, and Benadryl for postnasal drainage Let us know if sinuses not improving by the end of the week.

## 2018-10-12 ENCOUNTER — Other Ambulatory Visit: Payer: Self-pay | Admitting: Family Medicine

## 2019-01-27 ENCOUNTER — Other Ambulatory Visit: Payer: Self-pay

## 2019-01-27 ENCOUNTER — Ambulatory Visit (INDEPENDENT_AMBULATORY_CARE_PROVIDER_SITE_OTHER): Payer: Medicare Other

## 2019-01-27 DIAGNOSIS — Z0000 Encounter for general adult medical examination without abnormal findings: Secondary | ICD-10-CM

## 2019-01-27 NOTE — Progress Notes (Signed)
Subjective:   Gabriel Mccormick. Gabriel Mccormick is a 68 y.o. male who presents for Medicare Annual/Subsequent preventive examination.    This visit is being conducted through telemedicine due to the COVID-19 pandemic. This patient has given me verbal consent via doximity to conduct this visit, patient states they are participating from their home address. Some vital signs may be absent or patient reported.    Patient identification: identified by name, DOB, and current address  Review of Systems:  N/A  Cardiac Risk Factors include: advanced age (>86men, >44 women);dyslipidemia;hypertension;male gender     Objective:    Vitals: There were no vitals taken for this visit.  There is no height or weight on file to calculate BMI. Unable to obtain vitals due to visit being conducted via telephonically.   Advanced Directives 01/27/2019 01/22/2018 01/02/2016 12/28/2014  Does Patient Have a Medical Advance Directive? No Yes No No  Type of Advance Directive - Living will - -  Would patient like information on creating a medical advance directive? No - Patient declined - - No - patient declined information    Tobacco Social History   Tobacco Use  Smoking Status Never Smoker  Smokeless Tobacco Never Used     Counseling given: Not Answered   Clinical Intake:  Pre-visit preparation completed: No  Pain : No/denies pain Pain Score: 0-No pain     Nutritional Risks: None Diabetes: No  How often do you need to have someone help you when you read instructions, pamphlets, or other written materials from your doctor or pharmacy?: 1 - Never  Interpreter Needed?: No  Information entered by :: [facility], LPN  Past Medical History:  Diagnosis Date  . Hyperlipidemia   . Hypertension    Past Surgical History:  Procedure Laterality Date  . HERNIA REPAIR    . MANDIBLE SURGERY     asymmetry corrected   Family History  Problem Relation Age of Onset  . GER disease Mother   . Dementia Mother   . Lung  cancer Father   . Cancer Maternal Grandfather   . Diabetes Paternal Grandmother   . Heart attack Paternal Grandmother   . Leukemia Paternal Grandfather   . Diabetes Maternal Grandmother    Social History   Socioeconomic History  . Marital status: Married    Spouse name: Not on file  . Number of children: 3  . Years of education: college  . Highest education level: Bachelor's degree (e.g., BA, AB, BS)  Occupational History  . Occupation: retired  . Occupation: part time work for [employer] operational officer  Social Needs  . Financial resource strain: Not hard at all  . Food insecurity    Worry: Never true    Inability: Never true  . Transportation needs    Medical: No    Non-medical: No  Tobacco Use  . Smoking status: Never Smoker  . Smokeless tobacco: Never Used  Substance and Sexual Activity  . Alcohol use: Yes    Alcohol/week: 0.0 standard drinks    Comment: maybe 3 drinks a month  . Drug use: No  . Sexual activity: Yes    Birth control/protection: None  Lifestyle  . Physical activity    Days per week: 0 days    Minutes per session: 0 min  . Stress: Not at all  Relationships  . Social connections    Talks on phone: Patient refused    Gets together: Patient refused    Attends religious service: Patient refused    Active member  of club or organization: Patient refused    Attends meetings of clubs or organizations: Patient refused    Relationship status: Patient refused  Other Topics Concern  . Not on file  Social History Narrative  . Not on file    Outpatient Encounter Medications as of 01/27/2019  Medication Sig  . amLODipine (NORVASC) 5 MG tablet TAKE ONE TABLET EVERY DAY  . aspirin 81 MG tablet Take 81 mg by mouth daily. Takes 5 out of 7 days a week  . ibuprofen (ADVIL,MOTRIN) 200 MG tablet Take 400 mg by mouth every 6 (six) hours as needed.   . MULTIPLE VITAMIN PO Take by mouth daily.   . Omega-3 Fatty Acids (FISH OIL) 1200 MG CAPS Take by mouth daily.   .  rosuvastatin (CRESTOR) 10 MG tablet TAKE 1 TABLET BY MOUTH DAILY  . diflunisal (DOLOBID) 500 MG TABS tablet Take 1 tablet (500 mg total) by mouth 2 (two) times daily as needed. (Patient not taking: Reported on 01/27/2019)   No facility-administered encounter medications on file as of 01/27/2019.     Activities of Daily Living In your present state of health, do you have any difficulty performing the following activities: 01/27/2019  Hearing? N  Vision? N  Comment Wears eye glasses daily.  Difficulty concentrating or making decisions? N  Walking or climbing stairs? N  Dressing or bathing? N  Doing errands, shopping? N  Preparing Food and eating ? N  Using the Toilet? N  In the past six months, have you accidently leaked urine? N  Do you have problems with loss of bowel control? N  Managing your Medications? N  Managing your Finances? N  Housekeeping or managing your Housekeeping? N  Some recent data might be hidden    Patient Care Team: Maple HudsonGilbert, Octave L Jr., MD as PCP - General (Family Medicine) Kathryne HitchBlackman, Christopher Y, MD as Consulting Physician (Orthopedic Surgery) Dasher, Cliffton Astersavid A, MD (Dermatology)   Assessment:   This is a routine wellness examination for Gabriel Mccormick.  Exercise Activities and Dietary recommendations Current Exercise Habits: The patient does not participate in regular exercise at present, Exercise limited by: None identified  Goals    . DIET - INCREASE WATER INTAKE     Recommend increasing water intake to 4 glasses a day.        Fall Risk: Fall Risk  01/27/2019 07/28/2018 01/22/2018 01/08/2017 01/02/2016  Falls in the past year? 0 0 No No No    FALL RISK PREVENTION PERTAINING TO THE HOME:  Any stairs in or around the home? Yes  If so, are there any without handrails? No   Home free of loose throw rugs in walkways, pet beds, electrical cords, etc? Yes  Adequate lighting in your home to reduce risk of falls? Yes   ASSISTIVE DEVICES UTILIZED TO PREVENT FALLS:   Life alert? No  Use of a cane, walker or w/c? No  Grab bars in the bathroom? Yes  Shower chair or bench in shower? Yes  Elevated toilet seat or a handicapped toilet? Yes   TIMED UP AND GO:  Was the test performed? No .    Depression Screen PHQ 2/9 Scores 01/27/2019 07/28/2018 01/22/2018 01/22/2018  PHQ - 2 Score 0 0 0 0  PHQ- 9 Score - - 0 -    Cognitive Function     6CIT Screen 01/27/2019  What Year? 0 points  What month? 0 points  What time? 0 points  Count back from 20 0 points  Months in reverse 0 points  Repeat phrase 0 points  Total Score 0    Immunization History  Administered Date(s) Administered  . Influenza, High Dose Seasonal PF 06/11/2018  . Pneumococcal Conjugate-13 06/11/2018  . Tdap 12/01/2008  . Zoster 12/12/2011    Qualifies for Shingles Vaccine? Yes  Zostavax completed 12/11/01. Due for Shingrix. Education has been provided regarding the importance of this vaccine. Pt has been advised to call insurance company to determine out of pocket expense. Advised may also receive vaccine at local pharmacy or Health Dept. Verbalized acceptance and understanding.  Tdap: Although this vaccine is not a covered service during a Wellness Exam, does the patient still wish to receive this vaccine today?  No .  Education has been provided regarding the importance of this vaccine. Advised may receive this vaccine at local pharmacy or Health Dept. Aware to provide a copy of the vaccination record if obtained from local pharmacy or Health Dept. Verbalized acceptance and understanding.  Flu Vaccine: Up to date  Pneumococcal Vaccine: Completed series  Screening Tests Health Maintenance  Topic Date Due  . TETANUS/TDAP  12/02/2018  . INFLUENZA VACCINE  02/27/2019  . PNA vac Low Risk Adult (2 of 2 - PPSV23) 06/12/2019  . COLONOSCOPY  07/07/2019  . Hepatitis C Screening  Completed   Cancer Screenings:  Colorectal Screening: Completed 07/06/14. Repeat every 5 years.   Lung  Cancer Screening: (Low Dose CT Chest recommended if Age 40-80 years, 30 pack-year currently smoking OR have quit w/in 15years.) does not qualify.   Additional Screening:  Hepatitis C Screening: Up to date  Vision Screening: Recommended annual ophthalmology exams for early detection of glaucoma and other disorders of the eye.  Dental Screening: Recommended annual dental exams for proper oral hygiene  Community Resource Referral:  CRR required this visit?  No        Plan:  I have personally reviewed and addressed the Medicare Annual Wellness questionnaire and have noted the following in the patient's chart:  A. Medical and social history B. Use of alcohol, tobacco or illicit drugs  C. Current medications and supplements D. Functional ability and status E.  Nutritional status F.  Physical activity G. Advance directives H. List of other physicians I.  Hospitalizations, surgeries, and ER visits in previous 12 months J.  Vitals K. Screenings such as hearing and vision if needed, cognitive and depression L. Referrals and appointments   In addition, I have reviewed and discussed with patient certain preventive protocols, quality metrics, and best practice recommendations. A written personalized care plan for preventive services as well as general preventive health recommendations were provided to patient.   Darrick HuntsmanSigned,   Nolen Lindamood, LPN  1/6/10967/07/2018 Nurse Health Advisor   Nurse Notes: None.

## 2019-01-27 NOTE — Patient Instructions (Signed)
Gabriel Mccormick , Thank you for taking time to come for your Medicare Wellness Visit. I appreciate your ongoing commitment to your health goals. Please review the following plan we discussed and let me know if I can assist you in the future.   Screening recommendations/referrals: Colonoscopy: Up to date, due 06/2019 Recommended yearly ophthalmology/optometry visit for glaucoma screening and checkup Recommended yearly dental visit for hygiene and checkup  Vaccinations: Influenza vaccine: Up to date Pneumococcal vaccine: Completed series Tdap vaccine: Pt declines today.  Shingles vaccine: Pt declines today.     Advanced directives: Advance directive discussed with you today. Even though you declined this today please call our office should you change your mind and we can give you the proper paperwork for you to fill out.  Conditions/risks identified: Continue to increase water intake to 6-8 8 oz glasses a day.   Next appointment: 01/28/19 @ 9:00 AM with Dr Rosanna Randy.   Preventive Care 68 Years and Older, Male Preventive care refers to lifestyle choices and visits with your health care provider that can promote health and wellness. What does preventive care include?  A yearly physical exam. This is also called an annual well check.  Dental exams once or twice a year.  Routine eye exams. Ask your health care provider how often you should have your eyes checked.  Personal lifestyle choices, including:  Daily care of your teeth and gums.  Regular physical activity.  Eating a healthy diet.  Avoiding tobacco and drug use.  Limiting alcohol use.  Practicing safe sex.  Taking low doses of aspirin every day.  Taking vitamin and mineral supplements as recommended by your health care provider. What happens during an annual well check? The services and screenings done by your health care provider during your annual well check will depend on your age, overall health, lifestyle risk factors,  and family history of disease. Counseling  Your health care provider may ask you questions about your:  Alcohol use.  Tobacco use.  Drug use.  Emotional well-being.  Home and relationship well-being.  Sexual activity.  Eating habits.  History of falls.  Memory and ability to understand (cognition).  Work and work Statistician. Screening  You may have the following tests or measurements:  Height, weight, and BMI.  Blood pressure.  Lipid and cholesterol levels. These may be checked every 5 years, or more frequently if you are over 56 years old.  Skin check.  Lung cancer screening. You may have this screening every year starting at age 50 if you have a 30-pack-year history of smoking and currently smoke or have quit within the past 15 years.  Fecal occult blood test (FOBT) of the stool. You may have this test every year starting at age 36.  Flexible sigmoidoscopy or colonoscopy. You may have a sigmoidoscopy every 5 years or a colonoscopy every 10 years starting at age 28.  Prostate cancer screening. Recommendations will vary depending on your family history and other risks.  Hepatitis C blood test.  Hepatitis B blood test.  Sexually transmitted disease (STD) testing.  Diabetes screening. This is done by checking your blood sugar (glucose) after you have not eaten for a while (fasting). You may have this done every 1-3 years.  Abdominal aortic aneurysm (AAA) screening. You may need this if you are a current or former smoker.  Osteoporosis. You may be screened starting at age 31 if you are at high risk. Talk with your health care provider about your test results, treatment  options, and if necessary, the need for more tests. Vaccines  Your health care provider may recommend certain vaccines, such as:  Influenza vaccine. This is recommended every year.  Tetanus, diphtheria, and acellular pertussis (Tdap, Td) vaccine. You may need a Td booster every 10 years.   Zoster vaccine. You may need this after age 17.  Pneumococcal 13-valent conjugate (PCV13) vaccine. One dose is recommended after age 84.  Pneumococcal polysaccharide (PPSV23) vaccine. One dose is recommended after age 30. Talk to your health care provider about which screenings and vaccines you need and how often you need them. This information is not intended to replace advice given to you by your health care provider. Make sure you discuss any questions you have with your health care provider. Document Released: 08/11/2015 Document Revised: 04/03/2016 Document Reviewed: 05/16/2015 Elsevier Interactive Patient Education  2017 [employer] Prevention in the Home Falls can cause injuries. They can happen to people of all ages. There are many things you can do to make your home safe and to help prevent falls. What can I do on the outside of my home?  Regularly fix the edges of walkways and driveways and fix any cracks.  Remove anything that might make you trip as you walk through a door, such as a raised step or threshold.  Trim any bushes or trees on the path to your home.  Use bright outdoor lighting.  Clear any walking paths of anything that might make someone trip, such as rocks or tools.  Regularly check to see if handrails are loose or broken. Make sure that both sides of any steps have handrails.  Any raised decks and porches should have guardrails on the edges.  Have any leaves, snow, or ice cleared regularly.  Use sand or salt on walking paths during winter.  Clean up any spills in your garage right away. This includes oil or grease spills. What can I do in the bathroom?  Use night lights.  Install grab bars by the toilet and in the tub and shower. Do not use towel bars as grab bars.  Use non-skid mats or decals in the tub or shower.  If you need to sit down in the shower, use a plastic, non-slip stool.  Keep the floor dry. Clean up any water that spills on  the floor as soon as it happens.  Remove soap buildup in the tub or shower regularly.  Attach bath mats securely with double-sided non-slip rug tape.  Do not have throw rugs and other things on the floor that can make you trip. What can I do in the bedroom?  Use night lights.  Make sure that you have a light by your bed that is easy to reach.  Do not use any sheets or blankets that are too big for your bed. They should not hang down onto the floor.  Have a firm chair that has side arms. You can use this for support while you get dressed.  Do not have throw rugs and other things on the floor that can make you trip. What can I do in the kitchen?  Clean up any spills right away.  Avoid walking on wet floors.  Keep items that you use a lot in easy-to-reach places.  If you need to reach something above you, use a strong step stool that has a grab bar.  Keep electrical cords out of the way.  Do not use floor polish or wax that makes floors slippery.  If you must use wax, use non-skid floor wax.  Do not have throw rugs and other things on the floor that can make you trip. What can I do with my stairs?  Do not leave any items on the stairs.  Make sure that there are handrails on both sides of the stairs and use them. Fix handrails that are broken or loose. Make sure that handrails are as long as the stairways.  Check any carpeting to make sure that it is firmly attached to the stairs. Fix any carpet that is loose or worn.  Avoid having throw rugs at the top or bottom of the stairs. If you do have throw rugs, attach them to the floor with carpet tape.  Make sure that you have a light switch at the top of the stairs and the bottom of the stairs. If you do not have them, ask someone to add them for you. What else can I do to help prevent falls?  Wear shoes that:  Do not have high heels.  Have rubber bottoms.  Are comfortable and fit you well.  Are closed at the toe. Do not  wear sandals.  If you use a stepladder:  Make sure that it is fully opened. Do not climb a closed stepladder.  Make sure that both sides of the stepladder are locked into place.  Ask someone to hold it for you, if possible.  Clearly mark and make sure that you can see:  Any grab bars or handrails.  First and last steps.  Where the edge of each step is.  Use tools that help you move around (mobility aids) if they are needed. These include:  Canes.  Walkers.  Scooters.  Crutches.  Turn on the lights when you go into a dark area. Replace any light bulbs as soon as they burn out.  Set up your furniture so you have a clear path. Avoid moving your furniture around.  If any of your floors are uneven, fix them.  If there are any pets around you, be aware of where they are.  Review your medicines with your doctor. Some medicines can make you feel dizzy. This can increase your chance of falling. Ask your doctor what other things that you can do to help prevent falls. This information is not intended to replace advice given to you by your health care provider. Make sure you discuss any questions you have with your health care provider. Document Released: 05/11/2009 Document Revised: 12/21/2015 Document Reviewed: 08/19/2014 Elsevier Interactive Patient Education  2017 [employer].

## 2019-01-28 ENCOUNTER — Ambulatory Visit (INDEPENDENT_AMBULATORY_CARE_PROVIDER_SITE_OTHER): Payer: Medicare Other | Admitting: Family Medicine

## 2019-01-28 ENCOUNTER — Encounter: Payer: Self-pay | Admitting: Family Medicine

## 2019-01-28 ENCOUNTER — Ambulatory Visit: Payer: Medicare Other

## 2019-01-28 VITALS — BP 128/74 | HR 72 | Temp 98.6°F | Resp 16 | Ht 71.0 in | Wt 215.0 lb

## 2019-01-28 DIAGNOSIS — Z1211 Encounter for screening for malignant neoplasm of colon: Secondary | ICD-10-CM | POA: Diagnosis not present

## 2019-01-28 DIAGNOSIS — E7800 Pure hypercholesterolemia, unspecified: Secondary | ICD-10-CM | POA: Diagnosis not present

## 2019-01-28 DIAGNOSIS — I10 Essential (primary) hypertension: Secondary | ICD-10-CM | POA: Diagnosis not present

## 2019-01-28 DIAGNOSIS — Z0000 Encounter for general adult medical examination without abnormal findings: Secondary | ICD-10-CM | POA: Diagnosis not present

## 2019-01-28 DIAGNOSIS — N400 Enlarged prostate without lower urinary tract symptoms: Secondary | ICD-10-CM

## 2019-01-28 LAB — IFOBT (OCCULT BLOOD): IFOBT: NEGATIVE

## 2019-01-28 NOTE — Progress Notes (Signed)
Patient: Gabriel Mccormick, Male    DOB: [birth year], 68 y.o.   MRN: [id number] Visit Date: 01/28/2019  Today's Provider: Megan Mansichard  Jr, MD   Chief Complaint  Patient presents with  . Annual Exam   Subjective:    Patient had AWE yesterday (01/27/2019)    Annual physical visit Gabriel Mccormick is a 68 y.o. male. He feels well. He reports exercising not regularly, but he does stay active. He reports he is sleeping well.  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 Daughter is expecting his second grandchild,both sons engaged. Colonoscopy- 07/06/2014. Dr. Mechele CollinElliott.  Internal hemorrhoids. Repeat in 5 years. Hx of colon polyps.    Review of Systems  Constitutional: Negative.   HENT: Negative.   Eyes: Negative.   Respiratory: Negative.   Cardiovascular: Negative.   Gastrointestinal: Negative.   Endocrine: Negative.   Genitourinary: Negative.   Musculoskeletal: Negative.   Skin: Negative.   Allergic/Immunologic: Negative.   Neurological: Negative.   Hematological: Negative.   Psychiatric/Behavioral: Negative.     Social History   Socioeconomic History  . Marital status: Married    Spouse name: Not on file  . Number of children: 3  . Years of education: college  . Highest education level: Bachelor's degree (e.g., BA, AB, BS)  Occupational History  . Occupation: retired  . Occupation: part time work for Human resources officerinsurance claims  Social Needs  . Financial resource strain: Not hard at all  . Food insecurity    Worry: Never true    Inability: Never true  . Transportation needs    Medical: No    Non-medical: No  Tobacco Use  . Smoking status: Never Smoker  . Smokeless tobacco: Never Used  Substance and Sexual Activity  . Alcohol use: Yes    Alcohol/week: 0.0 standard drinks    Comment: maybe 3 drinks a month  . Drug use: No  . Sexual activity: Yes    Birth control/protection: None  Lifestyle  . Physical activity    Days per week: 0 days    Minutes per session: 0 min   . Stress: Not at all  Relationships  . Social Musicianconnections    Talks on phone: Patient refused    Gets together: Patient refused    Attends religious service: Patient refused    Active member of club or organization: Patient refused    Attends meetings of clubs or organizations: Patient refused    Relationship status: Patient refused  . Intimate partner violence    Fear of current or ex partner: Patient refused    Emotionally abused: Patient refused    Physically abused: Patient refused    Forced sexual activity: Patient refused  Other Topics Concern  . Not on file  Social History Narrative  . Not on file    Past Medical History:  Diagnosis Date  . Hyperlipidemia   . Hypertension      Patient Active Problem List   Diagnosis Date Noted  . Hypertension 06/11/2018  . Pain in right hip 07/31/2017  . Unilateral primary osteoarthritis, right hip 07/31/2017  . Allergic rhinitis 12/27/2014  . Personal history of disease of skin and subcutaneous tissue 12/27/2014  . History of colon polyps 12/27/2014  . HLD (hyperlipidemia) 12/27/2014  . Arthritis, degenerative 12/27/2014    Past Surgical History:  Procedure Laterality Date  . HERNIA REPAIR    . MANDIBLE SURGERY     asymmetry corrected    His family history includes Cancer in  his maternal grandfather; Dementia in his mother; Diabetes in his maternal grandmother and paternal grandmother; GER disease in his mother; Heart attack in his paternal grandmother; Leukemia in his paternal grandfather; Lung cancer in his father.   Current Outpatient Medications:  .  amLODipine (NORVASC) 5 MG tablet, TAKE ONE TABLET EVERY DAY, Disp: 90 tablet, Rfl: 3 .  aspirin 81 MG tablet, Take 81 mg by mouth daily. Takes 5 out of 7 days a week, Disp: , Rfl:  .  ibuprofen (ADVIL,MOTRIN) 200 MG tablet, Take 400 mg by mouth every 6 (six) hours as needed. , Disp: , Rfl:  .  MULTIPLE VITAMIN PO, Take by mouth daily. , Disp: , Rfl:  .  Omega-3 Fatty Acids  (FISH OIL) 1200 MG CAPS, Take by mouth daily. , Disp: , Rfl:  .  rosuvastatin (CRESTOR) 10 MG tablet, TAKE 1 TABLET BY MOUTH DAILY, Disp: 90 tablet, Rfl: 3 .  diflunisal (DOLOBID) 500 MG TABS tablet, Take 1 tablet (500 mg total) by mouth 2 (two) times daily as needed. (Patient not taking: Reported on 01/27/2019), Disp: 60 tablet, Rfl: 2  Patient Care Team: Jerrol Banana., MD as PCP - General (Family Medicine) Mcarthur Rossetti, MD as Consulting Physician (Orthopedic Surgery) Dasher, Rayvon Char, MD (Dermatology)    Objective:    Vitals: BP 128/74   Pulse 72   Temp 98.6 F (37 C)   Resp 16   Ht 5\' 11"  (1.803 m)   Wt 215 lb (97.5 kg)   SpO2 97%   BMI 29.99 kg/m   Physical Exam Vitals signs reviewed.  Constitutional:      Appearance: He is well-developed.  HENT:     Head: Normocephalic and atraumatic.     Right Ear: External ear normal.     Left Ear: External ear normal.     Nose: Nose normal.  Eyes:     General: No scleral icterus.    Conjunctiva/sclera: Conjunctivae normal.     Pupils: Pupils are equal, round, and reactive to light.  Neck:     Musculoskeletal: Neck supple.     Thyroid: No thyromegaly.  Cardiovascular:     Rate and Rhythm: Normal rate and regular rhythm.     Heart sounds: Normal heart sounds.  Pulmonary:     Effort: Pulmonary effort is normal.     Breath sounds: Normal breath sounds.  Abdominal:     Palpations: Abdomen is soft.  Genitourinary:    Penis: Normal.      Scrotum/Testes: Normal.  Lymphadenopathy:     Cervical: No cervical adenopathy.  Skin:    General: Skin is warm and dry.     Comments: A few atypical nevi of back.  Neurological:     General: No focal deficit present.     Mental Status: He is alert and oriented to person, place, and time.  Psychiatric:        Behavior: Behavior normal.        Thought Content: Thought content normal.        Judgment: Judgment normal.     Activities of Daily Living In your present state  of health, do you have any difficulty performing the following activities: 01/27/2019  Hearing? N  Vision? N  Comment Wears eye glasses daily.  Difficulty concentrating or making decisions? N  Walking or climbing stairs? N  Dressing or bathing? N  Doing errands, shopping? N  Preparing Food and eating ? N  Using the Toilet? N  In  the past six months, have you accidently leaked urine? N  Do you have problems with loss of bowel control? N  Managing your Medications? N  Managing your Finances? N  Housekeeping or managing your Housekeeping? N  Some recent data might be hidden    Fall Risk Assessment Fall Risk  01/27/2019 07/28/2018 01/22/2018 01/08/2017 01/02/2016  Falls in the past year? 0 0 No No No     Depression Screen PHQ 2/9 Scores 01/27/2019 07/28/2018 01/22/2018 01/22/2018  PHQ - 2 Score 0 0 0 0  PHQ- 9 Score - - 0 -    6CIT Screen 01/27/2019  What Year? 0 points  What month? 0 points  What time? 0 points  Count back from 20 0 points  Months in reverse 0 points  Repeat phrase 0 points  Total Score 0      Assessment & Plan:     Annual Wellness Visit  Reviewed patient's Family Medical History Reviewed and updated list of patient's medical providers Assessment of cognitive impairment was done Assessed patient's functional ability Established a written schedule for health screening services Health Risk Assessent Completed and Reviewed  Exercise Activities and Dietary recommendations Goals    . DIET - INCREASE WATER INTAKE     Recommend increasing water intake to 4 glasses a day.        Immunization History  Administered Date(s) Administered  . Influenza, High Dose Seasonal PF 06/11/2018  . Pneumococcal Conjugate-13 06/11/2018  . Tdap 12/01/2008  . Zoster 12/12/2011    Health Maintenance  Topic Date Due  . TETANUS/TDAP  12/02/2018  . INFLUENZA VACCINE  02/27/2019  . PNA vac Low Risk Adult (2 of 2 - PPSV23) 06/12/2019  . COLONOSCOPY  07/07/2019  . Hepatitis C  Screening  Completed     Discussed health benefits of physical activity, and encouraged him to engage in regular exercise appropriate for his age and condition.   1. Annual physical exam   2. Medicare annual wellness visit, subsequent   3. Pure hypercholesterolemia  - Lipid panel - TSH  4. Essential hypertension  - CBC with Differential/Platelet - Comprehensive metabolic panel  5. Benign prostatic hyperplasia without lower urinary tract symptoms  - PSA  6. Colon cancer screening  - IFOBT POC (occult bld, rslt in office); Future - IFOBT POC (occult bld, rslt in office) 7.Right Hip Pain  7.Atypical Nevus Refer to Dermatology   Megan Mansichard  Jr, MD  [facility]

## 2019-01-29 LAB — CBC WITH DIFFERENTIAL/PLATELET
Basophils Absolute: 0.1 10*3/uL (ref 0.0–0.2)
Basos: 1 %
EOS (ABSOLUTE): 0.1 10*3/uL (ref 0.0–0.4)
Eos: 2 %
Hematocrit: 48.1 % (ref 37.5–51.0)
Hemoglobin: 16.2 g/dL (ref 13.0–17.7)
Immature Grans (Abs): 0 10*3/uL (ref 0.0–0.1)
Immature Granulocytes: 0 %
Lymphocytes Absolute: 1.8 10*3/uL (ref 0.7–3.1)
Lymphs: 22 %
MCH: 30.5 pg (ref 26.6–33.0)
MCHC: 33.7 g/dL (ref 31.5–35.7)
MCV: 91 fL (ref 79–97)
Monocytes Absolute: 0.7 10*3/uL (ref 0.1–0.9)
Monocytes: 8 %
Neutrophils Absolute: 5.5 10*3/uL (ref 1.4–7.0)
Neutrophils: 67 %
Platelets: 232 10*3/uL (ref 150–450)
RBC: 5.31 x10E6/uL (ref 4.14–5.80)
RDW: 11.9 % (ref 11.6–15.4)
WBC: 8.1 10*3/uL (ref 3.4–10.8)

## 2019-01-29 LAB — PSA: Prostate Specific Ag, Serum: 1.2 ng/mL (ref 0.0–4.0)

## 2019-01-29 LAB — LIPID PANEL
Chol/HDL Ratio: 3.8 ratio (ref 0.0–5.0)
Cholesterol, Total: 188 mg/dL (ref 100–199)
HDL: 49 mg/dL (ref >39)
LDL Calculated: 110 mg/dL — ABNORMAL HIGH (ref 0–99)
Triglycerides: 144 mg/dL (ref 0–149)
VLDL Cholesterol Cal: 29 mg/dL (ref 5–40)

## 2019-01-29 LAB — COMPREHENSIVE METABOLIC PANEL
ALT: 14 IU/L (ref 0–44)
AST: 18 IU/L (ref 0–40)
Albumin/Globulin Ratio: 2 (ref 1.2–2.2)
Albumin: 4.9 g/dL — ABNORMAL HIGH (ref 3.8–4.8)
Alkaline Phosphatase: 50 IU/L (ref 39–117)
BUN/Creatinine Ratio: 22 (ref 10–24)
BUN: 21 mg/dL (ref 8–27)
Bilirubin Total: 0.7 mg/dL (ref 0.0–1.2)
CO2: 22 mmol/L (ref 20–29)
Calcium: 9.9 mg/dL (ref 8.6–10.2)
Chloride: 99 mmol/L (ref 96–106)
Creatinine, Ser: 0.95 mg/dL (ref 0.76–1.27)
GFR calc Af Amer: 95 mL/min/{1.73_m2} (ref >59)
GFR calc non Af Amer: 82 mL/min/{1.73_m2} (ref >59)
Globulin, Total: 2.5 g/dL (ref 1.5–4.5)
Glucose: 100 mg/dL — ABNORMAL HIGH (ref 65–99)
Potassium: 4.3 mmol/L (ref 3.5–5.2)
Sodium: 139 mmol/L (ref 134–144)
Total Protein: 7.4 g/dL (ref 6.0–8.5)

## 2019-01-29 LAB — TSH: TSH: 2.35 u[IU]/mL (ref 0.450–4.500)

## 2019-05-05 ENCOUNTER — Ambulatory Visit (INDEPENDENT_AMBULATORY_CARE_PROVIDER_SITE_OTHER): Payer: Medicare Other

## 2019-05-05 ENCOUNTER — Other Ambulatory Visit: Payer: Self-pay

## 2019-05-05 DIAGNOSIS — Z23 Encounter for immunization: Secondary | ICD-10-CM

## 2019-06-14 ENCOUNTER — Other Ambulatory Visit: Payer: Self-pay | Admitting: Family Medicine

## 2019-06-14 DIAGNOSIS — I10 Essential (primary) hypertension: Secondary | ICD-10-CM

## 2019-06-15 ENCOUNTER — Ambulatory Visit: Payer: Self-pay | Admitting: Family Medicine

## 2019-06-16 ENCOUNTER — Other Ambulatory Visit: Payer: Self-pay

## 2019-06-16 DIAGNOSIS — Z20822 Contact with and (suspected) exposure to covid-19: Secondary | ICD-10-CM

## 2019-06-18 LAB — NOVEL CORONAVIRUS, NAA: SARS-CoV-2, NAA: NOT DETECTED

## 2019-06-22 NOTE — Progress Notes (Signed)
Patient: Gabriel Mccormick Male    DOB: 11/22/1950   68 y.o.   MRN: [id number] Visit Date: 06/28/2019  Today's Provider: Wilhemena Durie, MD   Chief Complaint  Patient presents with  . Follow-up   Subjective:     Patient is here for a Follow up from his last visit. He has altered his diet and has started not eating past 8pm until after 8 am and has seen weight loss from it. 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  Patient actually feeling well.  He has a new grandson that is just a few weeks old.  His name is Chiropractor. Overall patient is feeling well.  He has no complaints. Pure hypercholesterolemia From 01/28/2019-labs good.  Essential hypertension From 01/28/2019-labs good.  Benign prostatic hyperplasia without lower urinary tract symptoms From 01/28/2019-labs good.  Atypical Nevus From 01/28/2019-Referred to Dermatology.   No Known Allergies   Current Outpatient Medications:  .  amLODipine (NORVASC) 5 MG tablet, TAKE ONE TABLET BY MOUTH EVERY DAY, Disp: 90 tablet, Rfl: 1 .  aspirin 81 MG tablet, Take 81 mg by mouth daily. Takes 5 out of 7 days a week, Disp: , Rfl:  .  ibuprofen (ADVIL,MOTRIN) 200 MG tablet, Take 400 mg by mouth every 6 (six) hours as needed. , Disp: , Rfl:  .  MULTIPLE VITAMIN PO, Take by mouth daily. , Disp: , Rfl:  .  Omega-3 Fatty Acids (FISH OIL) 1200 MG CAPS, Take by mouth daily. , Disp: , Rfl:  .  rosuvastatin (CRESTOR) 10 MG tablet, TAKE 1 TABLET BY MOUTH DAILY, Disp: 90 tablet, Rfl: 3  Review of Systems  Constitutional: Negative for appetite change, chills and fever.  HENT: Positive for tinnitus. Negative for congestion, dental problem, drooling, ear discharge, ear pain, facial swelling, hearing loss, mouth sores, nosebleeds, postnasal drip, rhinorrhea, sinus pressure, sinus pain, sneezing, sore throat, trouble swallowing and voice change.   Eyes: Negative.   Respiratory: Negative for cough, chest tightness,  shortness of breath and wheezing.   Cardiovascular: Negative for chest pain and palpitations.  Gastrointestinal: Negative for abdominal distention, abdominal pain, anal bleeding, blood in stool, diarrhea, nausea and vomiting.  Endocrine: Negative.   Musculoskeletal: Positive for arthralgias. Negative for back pain and myalgias.  Allergic/Immunologic: Negative.   Neurological: Negative.   Hematological: Negative.   Psychiatric/Behavioral: Negative.     Social History   Tobacco Use  . Smoking status: Never Smoker  . Smokeless tobacco: Never Used  Substance Use Topics  . Alcohol use: Yes    Alcohol/week: 0.0 standard drinks    Comment: maybe 3 drinks a month      Objective:   BP 110/60   Pulse 63   Temp (!) 97.3 F (36.3 C) (Temporal)   Resp 16   Ht 6' (1.829 m)   Wt 208 lb 9.6 oz (94.6 kg)   SpO2 97%   BMI 28.29 kg/m  Vitals:   06/28/19 0816  BP: 110/60  Pulse: 63  Resp: 16  Temp: (!) 97.3 F (36.3 C)  TempSrc: Temporal  SpO2: 97%  Weight: 208 lb 9.6 oz (94.6 kg)  Height: 6' (1.829 m)  Body mass index is 28.29 kg/m.   Physical Exam Vitals signs reviewed.  Constitutional:      Appearance: He is well-developed.  HENT:     Head: Normocephalic and atraumatic.     Right Ear: External ear normal.     Left Ear: External  ear normal.     Nose: Nose normal.  Eyes:     General: No scleral icterus.    Conjunctiva/sclera: Conjunctivae normal.     Pupils: Pupils are equal, round, and reactive to light.  Neck:     Musculoskeletal: Neck supple.     Thyroid: No thyromegaly.  Cardiovascular:     Rate and Rhythm: Normal rate and regular rhythm.     Heart sounds: Normal heart sounds.  Pulmonary:     Effort: Pulmonary effort is normal.     Breath sounds: Normal breath sounds.  Abdominal:     Palpations: Abdomen is soft.  Lymphadenopathy:     Cervical: No cervical adenopathy.  Skin:    General: Skin is warm and dry.     Comments: A few atypical nevi of back.   Neurological:     General: No focal deficit present.     Mental Status: He is alert and oriented to person, place, and time.  Psychiatric:        Mood and Affect: Mood normal.        Behavior: Behavior normal.        Thought Content: Thought content normal.        Judgment: Judgment normal.      No results found for any visits on 06/28/19.     Assessment & Plan    1. Pure hypercholesterolemia Controlled on Crestor  2. Essential hypertension Controlled on amlodipine  3. Benign prostatic hyperplasia without lower urinary tract symptoms   4. Screening for colon cancer Refer for follow-up colonoscopy.  5. Need for pneumococcal vaccination I will see him back next year for annual physical.  He is doing well. - Pneumococcal polysaccharide vaccine 23-valent greater than or equal to 2yo subcutaneous/IM     Megan Mans, MD  [facility]

## 2019-06-28 ENCOUNTER — Encounter: Payer: Self-pay | Admitting: Family Medicine

## 2019-06-28 ENCOUNTER — Ambulatory Visit (INDEPENDENT_AMBULATORY_CARE_PROVIDER_SITE_OTHER): Payer: Medicare Other | Admitting: Family Medicine

## 2019-06-28 ENCOUNTER — Other Ambulatory Visit: Payer: Self-pay

## 2019-06-28 VITALS — BP 110/60 | HR 63 | Temp 97.3°F | Resp 16 | Ht 72.0 in | Wt 208.6 lb

## 2019-06-28 DIAGNOSIS — N400 Enlarged prostate without lower urinary tract symptoms: Secondary | ICD-10-CM

## 2019-06-28 DIAGNOSIS — Z1211 Encounter for screening for malignant neoplasm of colon: Secondary | ICD-10-CM

## 2019-06-28 DIAGNOSIS — Z23 Encounter for immunization: Secondary | ICD-10-CM | POA: Diagnosis not present

## 2019-06-28 DIAGNOSIS — I10 Essential (primary) hypertension: Secondary | ICD-10-CM | POA: Diagnosis not present

## 2019-06-28 DIAGNOSIS — E7800 Pure hypercholesterolemia, unspecified: Secondary | ICD-10-CM | POA: Diagnosis not present

## 2019-07-12 ENCOUNTER — Other Ambulatory Visit: Payer: Self-pay

## 2019-07-12 DIAGNOSIS — Z20822 Contact with and (suspected) exposure to covid-19: Secondary | ICD-10-CM

## 2019-07-13 LAB — NOVEL CORONAVIRUS, NAA: SARS-CoV-2, NAA: NOT DETECTED

## 2019-08-23 DIAGNOSIS — H43813 Vitreous degeneration, bilateral: Secondary | ICD-10-CM | POA: Diagnosis not present

## 2019-09-03 DIAGNOSIS — Z86010 Personal history of colonic polyps: Secondary | ICD-10-CM | POA: Diagnosis not present

## 2019-09-03 DIAGNOSIS — Z01812 Encounter for preprocedural laboratory examination: Secondary | ICD-10-CM | POA: Diagnosis not present

## 2019-10-11 ENCOUNTER — Other Ambulatory Visit: Payer: Self-pay | Admitting: Family Medicine

## 2019-10-15 DIAGNOSIS — Z01812 Encounter for preprocedural laboratory examination: Secondary | ICD-10-CM | POA: Diagnosis not present

## 2019-10-19 DIAGNOSIS — Z1211 Encounter for screening for malignant neoplasm of colon: Secondary | ICD-10-CM | POA: Diagnosis not present

## 2019-10-19 DIAGNOSIS — Z86010 Personal history of colonic polyps: Secondary | ICD-10-CM | POA: Diagnosis not present

## 2019-10-19 DIAGNOSIS — K5730 Diverticulosis of large intestine without perforation or abscess without bleeding: Secondary | ICD-10-CM | POA: Diagnosis not present

## 2019-10-19 DIAGNOSIS — K640 First degree hemorrhoids: Secondary | ICD-10-CM | POA: Diagnosis not present

## 2019-12-07 ENCOUNTER — Other Ambulatory Visit: Payer: Self-pay | Admitting: Family Medicine

## 2019-12-07 DIAGNOSIS — I10 Essential (primary) hypertension: Secondary | ICD-10-CM

## 2020-01-25 ENCOUNTER — Encounter: Payer: Self-pay | Admitting: Orthopaedic Surgery

## 2020-01-25 ENCOUNTER — Ambulatory Visit (INDEPENDENT_AMBULATORY_CARE_PROVIDER_SITE_OTHER): Payer: Medicare PPO

## 2020-01-25 ENCOUNTER — Other Ambulatory Visit: Payer: Self-pay

## 2020-01-25 ENCOUNTER — Ambulatory Visit: Payer: Medicare PPO | Admitting: Orthopaedic Surgery

## 2020-01-25 DIAGNOSIS — M5441 Lumbago with sciatica, right side: Secondary | ICD-10-CM | POA: Diagnosis not present

## 2020-01-25 DIAGNOSIS — M25551 Pain in right hip: Secondary | ICD-10-CM

## 2020-01-25 DIAGNOSIS — M1611 Unilateral primary osteoarthritis, right hip: Secondary | ICD-10-CM | POA: Diagnosis not present

## 2020-01-25 MED ORDER — DIFLUNISAL 500 MG PO TABS: 500.0000 mg | ORAL_TABLET | Freq: Two times a day (BID) | ORAL | 1 refills | Status: DC | PRN

## 2020-01-25 NOTE — Progress Notes (Signed)
Office Visit Note   Patient: Gabriel Mccormick           Date of Birth: 10/27/1950           MRN: [id number] Visit Date: 01/25/2020              Requested by: Maple Hudson., MD [address] [place],  [place] [address] PCP: Maple Hudson., MD   Assessment & Plan: Visit Diagnoses:  1. Pain in right hip   2. Acute right-sided low back pain with right-sided sciatica   3. Unilateral primary osteoarthritis, right hip     Plan: As of now, he is more interested in continuing the antibiotics and stretching which I agree with.  His son is getting married in [place] and late August.  I have encouraged him to call our office about the second week of August to then see Dr. Prince Rome about the third week of August to consider an intra-articular steroid injection in his right hip under ultrasound.  This could help him through his cross-country trip as well.  I did refill his anti-inflammatories.  All questions concerns were answered and addressed.  Follow-Up Instructions: Return if symptoms worsen or fail to improve.   Orders:  Orders Placed This Encounter  Procedures  . XR Lumbar Spine 2-3 Views  . XR HIP UNILAT W OR W/O PELVIS 2-3 VIEWS RIGHT   Meds ordered this encounter  Medications  . diflunisal (DOLOBID) 500 MG TABS tablet    Sig: Take 1 tablet (500 mg total) by mouth 2 (two) times daily as needed.    Dispense:  60 tablet    Refill:  1      Procedures: No procedures performed   Clinical Data: No additional findings.   Subjective: Chief Complaint  Patient presents with  . Lower Back - Pain  . Right Hip - Pain  The patient is well-known to me.  Have seen him for several years now.  He has severe and well-documented end-stage arthritis of his right hip.  It has flared up again in terms of his pain for last 2 weeks.  He was playing golf and he is getting more pain and discomfort around the hip itself.  He has trouble crossing the leg and putting  his shoes and socks on the right side.  X-rays in the past showed severe end-stage arthritis of his right hip.  He is worked on hip strengthening exercises and does try to stretch the hip out.  He takes anti-inflammatories as well.  He is considering hip replacement surgery potentially later in the year or the first of next year.  HPI  Review of Systems He currently denies any headache, chest pain, shortness of breath, fever, chills, nausea, vomiting  Objective: Vital Signs: There were no vitals taken for this visit.  Physical Exam He is alert and orient x3 and in no acute distress Ortho Exam Examination of his right hip essentially shows almost no internal and external rotation with severe stiffness.  His left hip exam is normal.  He has negative straight leg raise bilaterally.  He has good strength in his lower extremities and normal sensation. Specialty Comments:  No specialty comments available.  Imaging: XR HIP UNILAT W OR W/O PELVIS 2-3 VIEWS RIGHT  Result Date: 01/25/2020 An AP pelvis lateral right hip show severe end-stage arthritis of the right hip.  There is complete loss of joint space.  There is flattening of the femoral head.  There are sclerotic changes in the femoral head and acetabulum.  There is also cystic changes.  There are periarticular osteophytes.  XR Lumbar Spine 2-3 Views  Result Date: 01/25/2020 2 views of the lumbar spine show degenerative changes at several levels but no acute findings.    PMFS History: Patient Active Problem List   Diagnosis Date Noted  . Hypertension 06/11/2018  . Pain in right hip 07/31/2017  . Unilateral primary osteoarthritis, right hip 07/31/2017  . Allergic rhinitis 12/27/2014  . Personal history of disease of skin and subcutaneous tissue 12/27/2014  . History of colon polyps 12/27/2014  . Hyperlipidemia 12/27/2014  . Arthritis, degenerative 12/27/2014   Past Medical History:  Diagnosis Date  . Hyperlipidemia   .  Hypertension     Family History  Problem Relation Age of Onset  . GER disease Mother   . Dementia Mother   . Lung cancer Father   . Cancer Maternal Grandfather   . Diabetes Paternal Grandmother   . Heart attack Paternal Grandmother   . Leukemia Paternal Grandfather   . Diabetes Maternal Grandmother     Past Surgical History:  Procedure Laterality Date  . HERNIA REPAIR    . MANDIBLE SURGERY     asymmetry corrected   Social History   Occupational History  . Occupation: retired  . Occupation: part time work for Human resources officer  Tobacco Use  . Smoking status: Never Smoker  . Smokeless tobacco: Never Used  Vaping Use  . Vaping Use: Never used  Substance and Sexual Activity  . Alcohol use: Yes    Alcohol/week: 0.0 standard drinks    Comment: maybe 3 drinks a month  . Drug use: No  . Sexual activity: Yes    Birth control/protection: None

## 2020-02-01 NOTE — Progress Notes (Signed)
I,Gabriel Mccormick,acting as a scribe for Gabriel Mans, MD.,have documented all relevant documentation on the behalf of Gabriel Gettel, MD,as directed by  Gabriel Mans, MD while in the presence of Gabriel Mans, MD.  Annual Wellness Visit     Patient: Gabriel Mccormick, Male    DOB: 1950-08-22, 69 y.o.   MRN: [id number] Visit Date: 02/03/2020  Today's Provider: Megan Mans, MD   Chief Complaint  Patient presents with  . Annual Exam   Subjective    Gabriel Mccormick is a 69 y.o. male who presents today for his Annual Wellness Visit. He reports consuming a general diet. Exercises regularly He generally feels fairly well. He reports sleeping well. He does not have additional problems to discuss today.  He is married father of 3.  His daughter has given him 2 grandchildren and both sons are to be married this year.  HPI   Pt will be needing a right hip replacement in the near future.    Patient Active Problem List   Diagnosis Date Noted  . Hypertension 06/11/2018  . Pain in right hip 07/31/2017  . Unilateral primary osteoarthritis, right hip 07/31/2017  . Allergic rhinitis 12/27/2014  . Personal history of disease of skin and subcutaneous tissue 12/27/2014  . History of colon polyps 12/27/2014  . Hyperlipidemia 12/27/2014  . Arthritis, degenerative 12/27/2014   Past Medical History:  Diagnosis Date  . Hyperlipidemia   . Hypertension    Social History   Tobacco Use  . Smoking status: Never Smoker  . Smokeless tobacco: Never Used  Vaping Use  . Vaping Use: Never used  Substance Use Topics  . Alcohol use: Yes    Alcohol/week: 0.0 standard drinks    Comment: maybe 3 drinks a month / beer or wine  . Drug use: No   No Known Allergies   Medications: Outpatient Medications Prior to Visit  Medication Sig  . amLODipine (NORVASC) 5 MG tablet TAKE ONE TABLET BY MOUTH EVERY DAY  . aspirin 81 MG tablet Take 81 mg by mouth daily.     . diflunisal (DOLOBID) 500 MG TABS tablet Take 1 tablet (500 mg total) by mouth 2 (two) times daily as needed.  Marland Kitchen ibuprofen (ADVIL,MOTRIN) 200 MG tablet Take 400 mg by mouth every 6 (six) hours as needed.   . MULTIPLE VITAMIN PO Take by mouth daily.   . Omega-3 Fatty Acids (FISH OIL) 1200 MG CAPS Take by mouth daily.   . rosuvastatin (CRESTOR) 10 MG tablet TAKE ONE TABLET EVERY DAY   No facility-administered medications prior to visit.    No Known Allergies  Patient Care Team: Maple Hudson., MD as PCP - General (Family Medicine) Kathryne Hitch, MD as Consulting Physician (Orthopedic Surgery) Dasher, Cliffton Asters, MD (Dermatology) Galen Manila, MD as Referring Physician (Ophthalmology) Stanton Kidney, MD as Consulting Physician (Gastroenterology)  Review of Systems  Constitutional: Negative.   HENT: Negative.   Eyes: Negative.   Respiratory: Negative.   Cardiovascular: Negative.   Gastrointestinal: Negative.   Endocrine: Negative.   Genitourinary: Negative.   Musculoskeletal: Negative.   Skin: Negative.   Allergic/Immunologic: Negative.   Neurological: Negative.   Hematological: Negative.   Psychiatric/Behavioral: Negative.       Objective    Vitals: BP 122/62 (BP Location: Left Arm, Patient Position: Sitting, Cuff Size: Large)   Pulse 71   Temp (!) 97.1 F (36.2 C) (Temporal)   Wt 211 lb (95.7  kg)   SpO2 96%   BMI 28.62 kg/m    Physical Exam Constitutional:      Appearance: Normal appearance.  HENT:     Head: Normocephalic and atraumatic.     Right Ear: Tympanic membrane, ear canal and external ear normal.     Left Ear: Tympanic membrane, ear canal and external ear normal.     Nose: Nose normal.     Mouth/Throat:     Mouth: Mucous membranes are moist.     Pharynx: Oropharynx is clear.  Eyes:     Extraocular Movements: Extraocular movements intact.     Conjunctiva/sclera: Conjunctivae normal.     Pupils: Pupils are equal, round, and  reactive to light.  Neck:     Vascular: No carotid bruit.  Cardiovascular:     Rate and Rhythm: Normal rate and regular rhythm.     Pulses: Normal pulses.     Heart sounds: Normal heart sounds.  Pulmonary:     Effort: Pulmonary effort is normal.     Breath sounds: Normal breath sounds.  Abdominal:     General: Bowel sounds are normal.     Palpations: Abdomen is soft.     Tenderness: There is no abdominal tenderness.  Musculoskeletal:        General: No tenderness.     Cervical back: Normal range of motion. No tenderness.     Right lower leg: No edema.     Left lower leg: No edema.  Lymphadenopathy:     Cervical: No cervical adenopathy.  Skin:    General: Skin is warm and dry.  Neurological:     Mental Status: He is alert and oriented to person, place, and time. Mental status is at baseline.  Psychiatric:        Mood and Affect: Mood normal.        Behavior: Behavior normal.        Thought Content: Thought content normal.        Judgment: Judgment normal.      Most recent functional status assessment: In your present state of health, do you have any difficulty performing the following activities: 02/02/2020  Hearing? N  Vision? N  Difficulty concentrating or making decisions? N  Walking or climbing stairs? N  Dressing or bathing? N  Doing errands, shopping? N  Preparing Food and eating ? N  Using the Toilet? N  In the past six months, have you accidently leaked urine? N  Do you have problems with loss of bowel control? N  Managing your Medications? N  Managing your Finances? N  Housekeeping or managing your Housekeeping? N  Some recent data might be hidden   Most recent fall risk assessment: Fall Risk  02/02/2020  Falls in the past year? 0  Number falls in past yr: 0  Injury with Fall? 0    Most recent depression screenings: PHQ 2/9 Scores 02/02/2020 01/27/2019  PHQ - 2 Score 0 0  PHQ- 9 Score - -   Most recent cognitive screening: 6CIT Screen 01/27/2019  What  Year? 0 points  What month? 0 points  What time? 0 points  Count back from 20 0 points  Months in reverse 0 points  Repeat phrase 0 points  Total Score 0   Most recent Audit-C alcohol use screening Alcohol Use Disorder Test (AUDIT) 02/02/2020  1. How often do you have a drink containing alcohol? 2  2. How many drinks containing alcohol do you have on a typical day  when you are drinking? 0  3. How often do you have six or more drinks on one occasion? 0  AUDIT-C Score 2  Alcohol Brief Interventions/Follow-up AUDIT Score <7 follow-up not indicated   A score of 3 or more in women, and 4 or more in men indicates increased risk for alcohol abuse, EXCEPT if all of the points are from question 1   No results found for any visits on 02/03/20.  Assessment & Plan     Annual wellness visit done today including the all of the following: Reviewed patient's Family Medical History Reviewed and updated list of patient's medical providers Assessment of cognitive impairment was done Assessed patient's functional ability Established a written schedule for health screening services Health Risk Assessent Completed and Reviewed  Exercise Activities and Dietary recommendations Goals    . DIET - INCREASE WATER INTAKE     Recommend increasing water intake to 4 glasses a day.        Immunization History  Administered Date(s) Administered  . Fluad Quad(high Dose 65+) 05/05/2019  . Influenza, High Dose Seasonal PF 06/11/2018  . PFIZER SARS-COV-2 Vaccination 09/08/2019, 09/29/2019  . Pneumococcal Conjugate-13 06/11/2018  . Pneumococcal Polysaccharide-23 06/28/2019  . Tdap 12/01/2008  . Zoster 12/12/2011    Health Maintenance  Topic Date Due  . TETANUS/TDAP  02/01/2021 (Originally 12/02/2018)  . INFLUENZA VACCINE  02/27/2020  . COLONOSCOPY  10/18/2024  . COVID-19 Vaccine  Completed  . Hepatitis C Screening  Completed  . PNA vac Low Risk Adult  Completed     Discussed health benefits of  physical activity, and encouraged him to engage in regular exercise appropriate for his age and condition.    Problem List Items Addressed This Visit    None    Visit Diagnoses    Annual physical exam    -  Primary   Relevant Orders   CBC with Differential/Platelet (Completed)   Comprehensive metabolic panel (Completed)   TSH (Completed)   Lipid panel (Completed)   Screening for prostate cancer       Relevant Orders   PSA (Completed)     Patient had screening colonoscopy spring 2020 and should repeat in 5 years per GI. Plan see him back in 1 year.  No follow-ups on file.        Lynx Wendelyn Breslow, MD  [facility] [phone] (phone) [phone] (fax)  [facility]

## 2020-02-01 NOTE — Progress Notes (Signed)
Subjective:   Gabriel Mccormick is a 69 y.o. male who presents for Medicare Annual/Subsequent preventive examination.  I connected with Gabriel Mccormick today by telephone and verified that I am speaking with the correct person using two identifiers. Location patient: home Location provider: work Persons participating in the virtual visit: patient, provider.   I discussed the limitations, risks, security and privacy concerns of performing an evaluation and management service by telephone and the availability of in person appointments. I also discussed with the patient that there may be a patient responsible charge related to this service. The patient expressed understanding and verbally consented to this telephonic visit.    Interactive audio and video telecommunications were attempted between this provider and patient, however failed, due to patient having technical difficulties OR patient did not have access to video capability.  We continued and completed visit with audio only.    Review of Systems    N/A  Cardiac Risk Factors include: advanced age (>5men, >8 women);dyslipidemia;male gender;hypertension     Objective:    There were no vitals filed for this visit. There is no height or weight on file to calculate BMI.  Advanced Directives 02/02/2020 01/27/2019 01/22/2018 01/02/2016 12/28/2014  Does Patient Have a Medical Advance Directive? Yes No Yes No No  Type of Advance Directive Living will - Living will - -  Would patient like information on creating a medical advance directive? - No - Patient declined - - No - patient declined information    Current Medications (verified) Outpatient Encounter Medications as of 02/02/2020  Medication Sig  . amLODipine (NORVASC) 5 MG tablet TAKE ONE TABLET BY MOUTH EVERY DAY  . aspirin 81 MG tablet Take 81 mg by mouth daily.   . diflunisal (DOLOBID) 500 MG TABS tablet Take 1 tablet (500 mg total) by mouth 2 (two) times daily as needed.  Marland Kitchen  ibuprofen (ADVIL,MOTRIN) 200 MG tablet Take 400 mg by mouth every 6 (six) hours as needed.   . MULTIPLE VITAMIN PO Take by mouth daily.   . Omega-3 Fatty Acids (FISH OIL) 1200 MG CAPS Take by mouth daily.   . rosuvastatin (CRESTOR) 10 MG tablet TAKE ONE TABLET EVERY DAY   No facility-administered encounter medications on file as of 02/02/2020.    Allergies (verified) Patient has no known allergies.   History: Past Medical History:  Diagnosis Date  . Hyperlipidemia   . Hypertension    Past Surgical History:  Procedure Laterality Date  . HERNIA REPAIR    . MANDIBLE SURGERY     asymmetry corrected   Family History  Problem Relation Age of Onset  . GER disease Mother   . Dementia Mother   . Lung cancer Father   . Cancer Maternal Grandfather   . Diabetes Paternal Grandmother   . Heart attack Paternal Grandmother   . Leukemia Paternal Grandfather   . Diabetes Maternal Grandmother    Social History   Socioeconomic History  . Marital status: Married    Spouse name: Not on file  . Number of children: 3  . Years of education: college  . Highest education level: Bachelor's degree (e.g., BA, AB, BS)  Occupational History  . Occupation: retired  . Occupation: part time work for Human resources officer  Tobacco Use  . Smoking status: Never Smoker  . Smokeless tobacco: Never Used  Vaping Use  . Vaping Use: Never used  Substance and Sexual Activity  . Alcohol use: Yes    Alcohol/week: 0.0 standard drinks  Comment: maybe 3 drinks a month / beer or wine  . Drug use: No  . Sexual activity: Yes    Birth control/protection: None  Other Topics Concern  . Not on file  Social History Narrative  . Not on file   Social Determinants of Health   Financial Resource Strain: Low Risk   . Difficulty of Paying Living Expenses: Not hard at all  Food Insecurity: No Food Insecurity  . Worried About Programme researcher, broadcasting/film/video in the Last Year: Never true  . Ran Out of Food in the Last Year: Never  true  Transportation Needs: No Transportation Needs  . Lack of Transportation (Medical): No  . Lack of Transportation (Non-Medical): No  Physical Activity: Inactive  . Days of Exercise per Week: 0 days  . Minutes of Exercise per Session: 0 min  Stress: No Stress Concern Present  . Feeling of Stress : Not at all  Social Connections: Moderately Isolated  . Frequency of Communication with Friends and Family: More than three times a week  . Frequency of Social Gatherings with Friends and Family: More than three times a week  . Attends Religious Services: Never  . Active Member of Clubs or Organizations: No  . Attends Banker Meetings: Never  . Marital Status: Married    Tobacco Counseling Counseling given: Not Answered   Clinical Intake:  Pre-visit preparation completed: Yes  Pain : No/denies pain (Has chronic right hip pain.)     Nutritional Risks: None Diabetes: No  How often do you need to have someone help you when you read instructions, pamphlets, or other written materials from your doctor or pharmacy?: 1 - Never  Diabetic? No  Interpreter Needed?: No  Information entered by :: [facility], LPN   Activities of Daily Living In your present state of health, do you have any difficulty performing the following activities: 02/02/2020  Hearing? N  Vision? N  Difficulty concentrating or making decisions? N  Walking or climbing stairs? N  Dressing or bathing? N  Doing errands, shopping? N  Preparing Food and eating ? N  Using the Toilet? N  In the past six months, have you accidently leaked urine? N  Do you have problems with loss of bowel control? N  Managing your Medications? N  Managing your Finances? N  Housekeeping or managing your Housekeeping? N  Some recent data might be hidden    Patient Care Team: Maple Hudson., MD as PCP - General (Family Medicine) Kathryne Hitch, MD as Consulting Physician (Orthopedic Surgery) Dasher,  Cliffton Asters, MD (Dermatology) Galen Manila, MD as Referring Physician (Ophthalmology) Stanton Kidney, MD as Consulting Physician (Gastroenterology)  Indicate any recent Medical Services you may have received from other than [facility] providers in the past year (date may be approximate).     Assessment:   This is a routine wellness examination for [place].  Hearing/Vision screen No exam data present  Dietary issues and exercise activities discussed: Current Exercise Habits: The patient does not participate in regular exercise at present, Exercise limited by: orthopedic condition(s)  Goals    . DIET - INCREASE WATER INTAKE     Recommend increasing water intake to 4 glasses a day.       Depression Screen PHQ 2/9 Scores 02/02/2020 01/27/2019 07/28/2018 01/22/2018 01/22/2018 01/08/2017 01/08/2017  PHQ - 2 Score 0 0 0 0 0 0 0  PHQ- 9 Score - - - 0 - - 0    Fall Risk Fall  Risk  02/02/2020 01/27/2019 07/28/2018 01/22/2018 01/08/2017  Falls in the past year? 0 0 0 No No  Number falls in past yr: 0 - - - -  Injury with Fall? 0 - - - -    Any stairs in or around the home? Yes  If so, are there any without handrails? No  Home free of loose throw rugs in walkways, pet beds, electrical cords, etc? Yes  Adequate lighting in your home to reduce risk of falls? Yes   ASSISTIVE DEVICES UTILIZED TO PREVENT FALLS:  Life alert? No  Use of a cane, walker or w/c? No  Grab bars in the bathroom? No  Shower chair or bench in shower? No  Elevated toilet seat or a handicapped toilet? Yes   Cognitive Function: Declined today.      6CIT Screen 01/27/2019  What Year? 0 points  What month? 0 points  What time? 0 points  Count back from 20 0 points  Months in reverse 0 points  Repeat phrase 0 points  Total Score 0    Immunizations Immunization History  Administered Date(s) Administered  . Fluad Quad(high Dose 65+) 05/05/2019  . Influenza, High Dose Seasonal PF 06/11/2018  . PFIZER SARS-COV-2  Vaccination 09/08/2019, 09/29/2019  . Pneumococcal Conjugate-13 06/11/2018  . Pneumococcal Polysaccharide-23 06/28/2019  . Tdap 12/01/2008  . Zoster 12/12/2011    TDAP status: Due, Education has been provided regarding the importance of this vaccine. Advised may receive this vaccine at local pharmacy or Health Dept. Aware to provide a copy of the vaccination record if obtained from local pharmacy or Health Dept. Verbalized acceptance and understanding. Flu Vaccine status: Up to date Pneumococcal vaccine status: Up to date Covid-19 vaccine status: Completed vaccines  Qualifies for Shingles Vaccine? Yes   Zostavax completed Yes   Shingrix Completed?: No.    Education has been provided regarding the importance of this vaccine. Patient has been advised to call insurance company to determine out of pocket expense if they have not yet received this vaccine. Advised may also receive vaccine at local pharmacy or Health Dept. Verbalized acceptance and understanding.  Screening Tests Health Maintenance  Topic Date Due  . TETANUS/TDAP  02/01/2021 (Originally 12/02/2018)  . INFLUENZA VACCINE  02/27/2020  . COLONOSCOPY  10/18/2024  . COVID-19 Vaccine  Completed  . Hepatitis C Screening  Completed  . PNA vac Low Risk Adult  Completed    Health Maintenance  There are no preventive care reminders to display for this patient.  Colorectal cancer screening: Completed 10/19/19. Repeat every 5 years  Lung Cancer Screening: (Low Dose CT Chest recommended if Age 86-80 years, 30 pack-year currently smoking OR have quit w/in 15years.) does not qualify.   Additional Screening:  Hepatitis C Screening: Up to date  Vision Screening: Recommended annual ophthalmology exams for early detection of glaucoma and other disorders of the eye. Is the patient up to date with their annual eye exam?  Yes  Who is the provider or what is the name of the office in which the patient attends annual eye exams? Dr Druscilla Brownie at  [facility] If pt is not established with a provider, would they like to be referred to a provider to establish care? No .   Dental Screening: Recommended annual dental exams for proper oral hygiene  Community Resource Referral / Chronic Care Management: CRR required this visit?  No   CCM required this visit?  No      Plan:     I have  personally reviewed and noted the following in the patient's chart:   . Medical and social history . Use of alcohol, tobacco or illicit drugs  . Current medications and supplements . Functional ability and status . Nutritional status . Physical activity . Advanced directives . List of other physicians . Hospitalizations, surgeries, and ER visits in previous 12 months . Vitals . Screenings to include cognitive, depression, and falls . Referrals and appointments  In addition, I have reviewed and discussed with patient certain preventive protocols, quality metrics, and best practice recommendations. A written personalized care plan for preventive services as well as general preventive health recommendations were provided to patient.     Jarelyn Bambach [place], [place]   4/0/98117/01/2020   Nurse Notes: None.

## 2020-02-02 ENCOUNTER — Ambulatory Visit (INDEPENDENT_AMBULATORY_CARE_PROVIDER_SITE_OTHER): Payer: Medicare PPO

## 2020-02-02 ENCOUNTER — Other Ambulatory Visit: Payer: Self-pay

## 2020-02-02 DIAGNOSIS — Z0000 Encounter for general adult medical examination without abnormal findings: Secondary | ICD-10-CM | POA: Diagnosis not present

## 2020-02-02 NOTE — Patient Instructions (Signed)
Gabriel Mccormick , Thank you for taking time to come for your Medicare Wellness Visit. I appreciate your ongoing commitment to your health goals. Please review the following plan we discussed and let me know if I can assist you in the future.   Screening recommendations/referrals: Colonoscopy: Up to date, due 09/2024 Recommended yearly ophthalmology/optometry visit for glaucoma screening and checkup Recommended yearly dental visit for hygiene and checkup  Vaccinations: Influenza vaccine: Up to date Pneumococcal vaccine: Completed series Tdap vaccine: Currently due, declined today.  Shingles vaccine: Currently due, declined today.     Advanced directives: Please bring a copy of your POA (Power of Attorney) and/or Living Will to your next appointment.   Conditions/risks identified: Recommend to increase water intake to 6-8 8 oz glasses a day.   Next appointment: 02/03/20 @ 9:00 AM with Dr Sullivan Lone   Preventive Care 65 Years and Older, Male Preventive care refers to lifestyle choices and visits with your health care provider that can promote health and wellness. What does preventive care include?  A yearly physical exam. This is also called an annual well check.  Dental exams once or twice a year.  Routine eye exams. Ask your health care provider how often you should have your eyes checked.  Personal lifestyle choices, including:  Daily care of your teeth and gums.  Regular physical activity.  Eating a healthy diet.  Avoiding tobacco and drug use.  Limiting alcohol use.  Practicing safe sex.  Taking low doses of aspirin every day.  Taking vitamin and mineral supplements as recommended by your health care provider. What happens during an annual well check? The services and screenings done by your health care provider during your annual well check will depend on your age, overall health, lifestyle risk factors, and family history of disease. Counseling  Your health care provider  may ask you questions about your:  Alcohol use.  Tobacco use.  Drug use.  Emotional well-being.  Home and relationship well-being.  Sexual activity.  Eating habits.  History of falls.  Memory and ability to understand (cognition).  Work and work Astronomer. Screening  You may have the following tests or measurements:  Height, weight, and BMI.  Blood pressure.  Lipid and cholesterol levels. These may be checked every 5 years, or more frequently if you are over 12 years old.  Skin check.  Lung cancer screening. You may have this screening every year starting at age 94 if you have a 30-pack-year history of smoking and currently smoke or have quit within the past 15 years.  Fecal occult blood test (FOBT) of the stool. You may have this test every year starting at age 43.  Flexible sigmoidoscopy or colonoscopy. You may have a sigmoidoscopy every 5 years or a colonoscopy every 10 years starting at age 63.  Prostate cancer screening. Recommendations will vary depending on your family history and other risks.  Hepatitis C blood test.  Hepatitis B blood test.  Sexually transmitted disease (STD) testing.  Diabetes screening. This is done by checking your blood sugar (glucose) after you have not eaten for a while (fasting). You may have this done every 1-3 years.  Abdominal aortic aneurysm (AAA) screening. You may need this if you are a current or former smoker.  Osteoporosis. You may be screened starting at age 26 if you are at high risk. Talk with your health care provider about your test results, treatment options, and if necessary, the need for more tests. Vaccines  Your health care  provider may recommend certain vaccines, such as:  Influenza vaccine. This is recommended every year.  Tetanus, diphtheria, and acellular pertussis (Tdap, Td) vaccine. You may need a Td booster every 10 years.  Zoster vaccine. You may need this after age 43.  Pneumococcal 13-valent  conjugate (PCV13) vaccine. One dose is recommended after age 68.  Pneumococcal polysaccharide (PPSV23) vaccine. One dose is recommended after age 20. Talk to your health care provider about which screenings and vaccines you need and how often you need them. This information is not intended to replace advice given to you by your health care provider. Make sure you discuss any questions you have with your health care provider. Document Released: 08/11/2015 Document Revised: 04/03/2016 Document Reviewed: 05/16/2015 Elsevier Interactive Patient Education  2017 [employer].  Fall Prevention in the Home Falls can cause injuries. They can happen to people of all ages. There are many things you can do to make your home safe and to help prevent falls. What can I do on the outside of my home?  Regularly fix the edges of walkways and driveways and fix any cracks.  Remove anything that might make you trip as you walk through a door, such as a raised step or threshold.  Trim any bushes or trees on the path to your home.  Use bright outdoor lighting.  Clear any walking paths of anything that might make someone trip, such as rocks or tools.  Regularly check to see if handrails are loose or broken. Make sure that both sides of any steps have handrails.  Any raised decks and porches should have guardrails on the edges.  Have any leaves, snow, or ice cleared regularly.  Use sand or salt on walking paths during winter.  Clean up any spills in your garage right away. This includes oil or grease spills. What can I do in the bathroom?  Use night lights.  Install grab bars by the toilet and in the tub and shower. Do not use towel bars as grab bars.  Use non-skid mats or decals in the tub or shower.  If you need to sit down in the shower, use a plastic, non-slip stool.  Keep the floor dry. Clean up any water that spills on the floor as soon as it happens.  Remove soap buildup in the tub or  shower regularly.  Attach bath mats securely with double-sided non-slip rug tape.  Do not have throw rugs and other things on the floor that can make you trip. What can I do in the bedroom?  Use night lights.  Make sure that you have a light by your bed that is easy to reach.  Do not use any sheets or blankets that are too big for your bed. They should not hang down onto the floor.  Have a firm chair that has side arms. You can use this for support while you get dressed.  Do not have throw rugs and other things on the floor that can make you trip. What can I do in the kitchen?  Clean up any spills right away.  Avoid walking on wet floors.  Keep items that you use a lot in easy-to-reach places.  If you need to reach something above you, use a strong step stool that has a grab bar.  Keep electrical cords out of the way.  Do not use floor polish or wax that makes floors slippery. If you must use wax, use non-skid floor wax.  Do not have throw  rugs and other things on the floor that can make you trip. What can I do with my stairs?  Do not leave any items on the stairs.  Make sure that there are handrails on both sides of the stairs and use them. Fix handrails that are broken or loose. Make sure that handrails are as long as the stairways.  Check any carpeting to make sure that it is firmly attached to the stairs. Fix any carpet that is loose or worn.  Avoid having throw rugs at the top or bottom of the stairs. If you do have throw rugs, attach them to the floor with carpet tape.  Make sure that you have a light switch at the top of the stairs and the bottom of the stairs. If you do not have them, ask someone to add them for you. What else can I do to help prevent falls?  Wear shoes that:  Do not have high heels.  Have rubber bottoms.  Are comfortable and fit you well.  Are closed at the toe. Do not wear sandals.  If you use a stepladder:  Make sure that it is fully  opened. Do not climb a closed stepladder.  Make sure that both sides of the stepladder are locked into place.  Ask someone to hold it for you, if possible.  Clearly mark and make sure that you can see:  Any grab bars or handrails.  First and last steps.  Where the edge of each step is.  Use tools that help you move around (mobility aids) if they are needed. These include:  Canes.  Walkers.  Scooters.  Crutches.  Turn on the lights when you go into a dark area. Replace any light bulbs as soon as they burn out.  Set up your furniture so you have a clear path. Avoid moving your furniture around.  If any of your floors are uneven, fix them.  If there are any pets around you, be aware of where they are.  Review your medicines with your doctor. Some medicines can make you feel dizzy. This can increase your chance of falling. Ask your doctor what other things that you can do to help prevent falls. This information is not intended to replace advice given to you by your health care provider. Make sure you discuss any questions you have with your health care provider. Document Released: 05/11/2009 Document Revised: 12/21/2015 Document Reviewed: 08/19/2014 Elsevier Interactive Patient Education  2017 [employer].

## 2020-02-03 ENCOUNTER — Ambulatory Visit (INDEPENDENT_AMBULATORY_CARE_PROVIDER_SITE_OTHER): Payer: Medicare PPO | Admitting: Family Medicine

## 2020-02-03 ENCOUNTER — Encounter: Payer: Self-pay | Admitting: Family Medicine

## 2020-02-03 ENCOUNTER — Other Ambulatory Visit: Payer: Self-pay

## 2020-02-03 VITALS — BP 122/62 | HR 71 | Temp 97.1°F | Wt 211.0 lb

## 2020-02-03 DIAGNOSIS — Z0000 Encounter for general adult medical examination without abnormal findings: Secondary | ICD-10-CM

## 2020-02-03 DIAGNOSIS — Z125 Encounter for screening for malignant neoplasm of prostate: Secondary | ICD-10-CM

## 2020-02-03 DIAGNOSIS — E785 Hyperlipidemia, unspecified: Secondary | ICD-10-CM | POA: Diagnosis not present

## 2020-02-03 DIAGNOSIS — I10 Essential (primary) hypertension: Secondary | ICD-10-CM | POA: Diagnosis not present

## 2020-02-04 ENCOUNTER — Telehealth: Payer: Self-pay

## 2020-02-04 LAB — COMPREHENSIVE METABOLIC PANEL
ALT: 14 IU/L (ref 0–44)
AST: 16 IU/L (ref 0–40)
Albumin/Globulin Ratio: 1.7 (ref 1.2–2.2)
Albumin: 4.6 g/dL (ref 3.8–4.8)
Alkaline Phosphatase: 59 IU/L (ref 48–121)
BUN/Creatinine Ratio: 23 (ref 10–24)
BUN: 21 mg/dL (ref 8–27)
Bilirubin Total: 0.4 mg/dL (ref 0.0–1.2)
CO2: 21 mmol/L (ref 20–29)
Calcium: 9.9 mg/dL (ref 8.6–10.2)
Chloride: 102 mmol/L (ref 96–106)
Creatinine, Ser: 0.93 mg/dL (ref 0.76–1.27)
GFR calc Af Amer: 97 mL/min/{1.73_m2} (ref >59)
GFR calc non Af Amer: 84 mL/min/{1.73_m2} (ref >59)
Globulin, Total: 2.7 g/dL (ref 1.5–4.5)
Glucose: 95 mg/dL (ref 65–99)
Potassium: 4.3 mmol/L (ref 3.5–5.2)
Sodium: 140 mmol/L (ref 134–144)
Total Protein: 7.3 g/dL (ref 6.0–8.5)

## 2020-02-04 LAB — CBC WITH DIFFERENTIAL/PLATELET
Basophils Absolute: 0.1 10*3/uL (ref 0.0–0.2)
Basos: 1 %
EOS (ABSOLUTE): 0.3 10*3/uL (ref 0.0–0.4)
Eos: 3 %
Hematocrit: 45 % (ref 37.5–51.0)
Hemoglobin: 15.3 g/dL (ref 13.0–17.7)
Immature Grans (Abs): 0.1 10*3/uL (ref 0.0–0.1)
Immature Granulocytes: 1 %
Lymphocytes Absolute: 2 10*3/uL (ref 0.7–3.1)
Lymphs: 25 %
MCH: 31 pg (ref 26.6–33.0)
MCHC: 34 g/dL (ref 31.5–35.7)
MCV: 91 fL (ref 79–97)
Monocytes Absolute: 0.6 10*3/uL (ref 0.1–0.9)
Monocytes: 8 %
Neutrophils Absolute: 4.9 10*3/uL (ref 1.4–7.0)
Neutrophils: 62 %
Platelets: 263 10*3/uL (ref 150–450)
RBC: 4.93 x10E6/uL (ref 4.14–5.80)
RDW: 12.4 % (ref 11.6–15.4)
WBC: 7.9 10*3/uL (ref 3.4–10.8)

## 2020-02-04 LAB — LIPID PANEL
Chol/HDL Ratio: 3.9 ratio (ref 0.0–5.0)
Cholesterol, Total: 180 mg/dL (ref 100–199)
HDL: 46 mg/dL (ref >39)
LDL Chol Calc (NIH): 118 mg/dL — ABNORMAL HIGH (ref 0–99)
Triglycerides: 87 mg/dL (ref 0–149)
VLDL Cholesterol Cal: 16 mg/dL (ref 5–40)

## 2020-02-04 LAB — PSA: Prostate Specific Ag, Serum: 1.4 ng/mL (ref 0.0–4.0)

## 2020-02-04 LAB — TSH: TSH: 2.76 u[IU]/mL (ref 0.450–4.500)

## 2020-02-04 NOTE — Telephone Encounter (Signed)
Patient advised of lab results through mychart and has read the providers comments.  °

## 2020-02-04 NOTE — Telephone Encounter (Signed)
-----   Message from Maple Hudson., MD sent at 02/04/2020  9:07 AM EDT ----- Labs all good.

## 2020-02-10 ENCOUNTER — Ambulatory Visit: Payer: Medicare PPO | Attending: Internal Medicine

## 2020-02-10 DIAGNOSIS — Z20822 Contact with and (suspected) exposure to covid-19: Secondary | ICD-10-CM | POA: Diagnosis not present

## 2020-02-11 LAB — NOVEL CORONAVIRUS, NAA: SARS-CoV-2, NAA: NOT DETECTED

## 2020-02-11 LAB — SARS-COV-2, NAA 2 DAY TAT

## 2020-03-06 ENCOUNTER — Other Ambulatory Visit: Payer: Self-pay | Admitting: Family Medicine

## 2020-03-06 DIAGNOSIS — I10 Essential (primary) hypertension: Secondary | ICD-10-CM

## 2020-03-31 ENCOUNTER — Other Ambulatory Visit: Payer: Medicare PPO

## 2020-03-31 ENCOUNTER — Other Ambulatory Visit: Payer: Self-pay

## 2020-03-31 DIAGNOSIS — Z20822 Contact with and (suspected) exposure to covid-19: Secondary | ICD-10-CM | POA: Diagnosis not present

## 2020-04-01 LAB — NOVEL CORONAVIRUS, NAA: SARS-CoV-2, NAA: NOT DETECTED

## 2020-04-10 ENCOUNTER — Other Ambulatory Visit: Payer: Self-pay | Admitting: Family Medicine

## 2020-04-10 DIAGNOSIS — E7800 Pure hypercholesterolemia, unspecified: Secondary | ICD-10-CM

## 2020-04-12 DIAGNOSIS — D2271 Melanocytic nevi of right lower limb, including hip: Secondary | ICD-10-CM | POA: Diagnosis not present

## 2020-04-12 DIAGNOSIS — D2272 Melanocytic nevi of left lower limb, including hip: Secondary | ICD-10-CM | POA: Diagnosis not present

## 2020-04-12 DIAGNOSIS — L821 Other seborrheic keratosis: Secondary | ICD-10-CM | POA: Diagnosis not present

## 2020-04-12 DIAGNOSIS — D2261 Melanocytic nevi of right upper limb, including shoulder: Secondary | ICD-10-CM | POA: Diagnosis not present

## 2020-04-12 DIAGNOSIS — D2262 Melanocytic nevi of left upper limb, including shoulder: Secondary | ICD-10-CM | POA: Diagnosis not present

## 2020-04-12 DIAGNOSIS — L570 Actinic keratosis: Secondary | ICD-10-CM | POA: Diagnosis not present

## 2020-04-12 DIAGNOSIS — X32XXXA Exposure to sunlight, initial encounter: Secondary | ICD-10-CM | POA: Diagnosis not present

## 2020-04-12 DIAGNOSIS — D225 Melanocytic nevi of trunk: Secondary | ICD-10-CM | POA: Diagnosis not present

## 2020-04-17 ENCOUNTER — Other Ambulatory Visit: Payer: Medicare PPO

## 2020-05-01 ENCOUNTER — Ambulatory Visit: Payer: Medicare PPO | Attending: Internal Medicine

## 2020-05-01 DIAGNOSIS — Z23 Encounter for immunization: Secondary | ICD-10-CM

## 2020-05-01 NOTE — Progress Notes (Signed)
   Covid-19 Vaccination Clinic  Name:  Gabriel Mccormick    MRN: [id number] DOB: 02-Jan-1951  05/01/2020  [name] was observed post Covid-19 immunization for 15 minutes without incident. He was provided with Vaccine Information Sheet and instruction to access the V-Safe system.   [name] was instructed to call 911 with any severe reactions post vaccine: Marland Kitchen Difficulty breathing  . Swelling of face and throat  . A fast heartbeat  . A bad rash all over body  . Dizziness and weakness

## 2020-05-31 ENCOUNTER — Ambulatory Visit: Payer: Medicare PPO

## 2020-06-15 ENCOUNTER — Encounter: Payer: Self-pay | Admitting: Orthopaedic Surgery

## 2020-06-15 ENCOUNTER — Ambulatory Visit: Payer: Medicare PPO | Admitting: Orthopaedic Surgery

## 2020-06-15 VITALS — Ht 72.0 in | Wt 211.0 lb

## 2020-06-15 DIAGNOSIS — M1611 Unilateral primary osteoarthritis, right hip: Secondary | ICD-10-CM

## 2020-06-15 DIAGNOSIS — M25551 Pain in right hip: Secondary | ICD-10-CM

## 2020-06-15 NOTE — Progress Notes (Signed)
The patient is well-known to me.  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  I been following him for well over a year.  At this point his right hip pain is now detriment affecting his mobility, his quality of life and his actives daily living.  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  He is an avid Teacher, English as a foreign language.  He is 69 years old and very healthy and active.  His pain can be 10 out of 10 at this point with his right hip.  On exam he has significant stiffness with internal and external rotation of the right hip.  There is also pain in the groin.  He does have a leg length discrepancy when I lay him in a supine position with his left side longer than the right arthritic hip side.  He currently has no acute changes in medical status.  He denies any headache, chest pain, shortness of breath, fever, chills, nausea, vomiting.  I did review his past medical history is well.  His wife is with him today.  I have performed hip replacement surgery on her.  He is fully aware of the risk and benefits of the surgery and what to expect from an intraoperative and postoperative course.  He has had a copy of the handout before has seen his x-rays.  All questions and concerns were answered addressed.  He would like to have this scheduled sometime mid-to-late January 2022.  We will be in touch about scheduling surgery.

## 2020-08-01 ENCOUNTER — Other Ambulatory Visit: Payer: Self-pay

## 2020-08-16 ENCOUNTER — Other Ambulatory Visit: Payer: Self-pay | Admitting: Physician Assistant

## 2020-08-18 ENCOUNTER — Encounter (HOSPITAL_COMMUNITY): Admission: RE | Admit: 2020-08-18 | Payer: Medicare PPO | Source: Ambulatory Visit

## 2020-08-21 NOTE — Progress Notes (Addendum)
PCP - Julieanne Manson, MD Cardiologist - no  PPM/ICD -  Device Orders -  Rep Notified -   Chest x-ray -  EKG - 08-22-20 Stress Test -  ECHO -  Cardiac Cath -   Sleep Study -  CPAP -   Fasting Blood Sugar -  Checks Blood Sugar _____ times a day  Blood Thinner Instructions: Aspirin Instructions:81 mg last dose 08-20-20  ERAS Protcol - PRE-SURGERY Ensure    COVID TEST- 08-22-20  Activity-- mows own yard and walks golf course without SOB Anesthesia review: HTN  Patient denies shortness of breath, fever, cough and chest pain at PAT appointment  none   All instructions explained to the patient, with a verbal understanding of the material. Patient agrees to go over the instructions while at home for a better understanding. Patient also instructed to self quarantine after being tested for COVID-19. The opportunity to ask questions was provided.

## 2020-08-21 NOTE — Patient Instructions (Signed)
DUE TO COVID-19 ONLY ONE VISITOR IS ALLOWED TO COME WITH YOU AND STAY IN THE WAITING ROOM ONLY DURING PRE OP AND PROCEDURE DAY OF SURGERY. THE 1 VISITOR  MAY VISIT WITH YOU AFTER SURGERY IN YOUR PRIVATE ROOM DURING VISITING HOURS ONLY!  YOU NEED TO HAVE A COVID 19 TEST ON_1-25-22______ @_______ , THIS TEST MUST BE DONE BEFORE SURGERY,  COVID TESTING SITE [address] [place] , IT IS ON THE RIGHT GOING OUT [address] APPROXIMATELY  2 MINUTES PAST [employer] ON THE RIGHT. ONCE YOUR COVID TEST IS COMPLETED,  PLEASE BEGIN THE QUARANTINE INSTRUCTIONS AS OUTLINED IN YOUR HANDOUT.                Gabriel Mccormick  08/21/2020   Your procedure is scheduled on: 08-25-20   Report to [facility] Main  Entrance   Report to short stay  at       0530 AM     Call this number if you have problems the morning of surgery [phone]    Remember: NO SOLID FOOD AFTER MIDNIGHT THE NIGHT PRIOR TO SURGERY. NOTHING BY MOUTH EXCEPT CLEAR LIQUIDS UNTIL   0415 am . PLEASE FINISH ENSURE DRINK PER SURGEON ORDER  WHICH NEEDS TO BE COMPLETED AT      0415 am then nothing by mouth.    CLEAR LIQUID DIET                                                                                                                           Foods Excluded water Black Coffee and tea, regular and decaf                                            liquids that you cannot  Plain Jell-O any favor except red or purple                                           see through such as: Fruit ices (not with fruit pulp)                                                               milk, soups, orange juice  Iced Popsicles  All solid food Carbonated beverages, regular and diet                                    Cranberry, grape and apple juices Sports drinks like Gatorade Lightly seasoned clear broth or consume(fat free) Sugar, honey  syrup   _____________________________________________________________________     BRUSH YOUR TEETH MORNING OF SURGERY AND RINSE YOUR MOUTH OUT, NO CHEWING GUM CANDY OR MINTS.     Take these medicines the morning of surgery with A SIP OF WATER: crestor, amlodipine                                 You may not have any metal on your body including hair pins and              piercings  Do not wear jewelry lotions, powders or perfumes, deodorant                       Men may shave face and neck.   Do not bring valuables to the hospital. [facility] IS NOT             RESPONSIBLE   FOR VALUABLES.  Contacts, dentures or bridgework may not be worn into surgery.      Patients discharged the day of surgery will not be allowed to drive home. IF YOU ARE HAVING SURGERY AND GOING HOME THE SAME DAY, YOU MUST HAVE AN ADULT TO DRIVE YOU HOME AND BE WITH YOU FOR 24 HOURS. YOU MAY GO HOME BY TAXI OR UBER OR ORTHERWISE, BUT AN ADULT MUST ACCOMPANY YOU HOME AND STAY WITH YOU FOR 24 HOURS.  Name and phone number of your driver:  Special Instructions: N/A              Please read over the following fact sheets you were given: _____________________________________________________________________             [facility] - Preparing for Surgery Before surgery, you can play an important role.  Because skin is not sterile, your skin needs to be as free of germs as possible.  You can reduce the number of germs on your skin by washing with CHG (chlorahexidine gluconate) soap before surgery.  CHG is an antiseptic cleaner which kills germs and bonds with the skin to continue killing germs even after washing. Please DO NOT use if you have an allergy to CHG or antibacterial soaps.  If your skin becomes reddened/irritated stop using the CHG and inform your nurse when you arrive at Short Stay. Do not shave (including legs and underarms) for at least 48 hours prior to the first CHG shower.  You may shave your  face/neck. Please follow these instructions carefully:  1.  Shower with CHG Soap the night before surgery and the  morning of Surgery.  2.  If you choose to wash your hair, wash your hair first as usual with your  normal  shampoo.  3.  After you shampoo, rinse your hair and body thoroughly to remove the  shampoo.                           4.  Use CHG as you would any other liquid soap.  You can apply chg directly  to the skin and  wash                       Gently with a scrungie or clean washcloth.  5.  Apply the CHG Soap to your body ONLY FROM THE NECK DOWN.   Do not use on face/ open                           Wound or open sores. Avoid contact with eyes, ears mouth and genitals (private parts).                       Wash face,  Genitals (private parts) with your normal soap.             6.  Wash thoroughly, paying special attention to the area where your surgery  will be performed.  7.  Thoroughly rinse your body with warm water from the neck down.  8.  DO NOT shower/wash with your normal soap after using and rinsing off  the CHG Soap.                9.  Pat yourself dry with a clean towel.            10.  Wear clean pajamas.            11.  Place clean sheets on your bed the night of your first shower and do not  sleep with pets. Day of Surgery : Do not apply any lotions/deodorants the morning of surgery.  Please wear clean clothes to the hospital/surgery center.  FAILURE TO FOLLOW THESE INSTRUCTIONS MAY RESULT IN THE CANCELLATION OF YOUR SURGERY PATIENT SIGNATURE_________________________________  NURSE SIGNATURE__________________________________  ________________________________________________________________________   Gabriel Mccormick  An incentive spirometer is a tool that can help keep your lungs clear and active. This tool measures how well you are filling your lungs with each breath. Taking long deep breaths may help reverse or decrease the chance of developing breathing  (pulmonary) problems (especially infection) following:  A long period of time when you are unable to move or be active. BEFORE THE PROCEDURE   If the spirometer includes an indicator to show your best effort, your nurse or respiratory therapist will set it to a desired goal.  If possible, sit up straight or lean slightly forward. Try not to slouch.  Hold the incentive spirometer in an upright position. INSTRUCTIONS FOR USE  1. Sit on the edge of your bed if possible, or sit up as far as you can in bed or on a chair. 2. Hold the incentive spirometer in an upright position. 3. Breathe out normally. 4. Place the mouthpiece in your mouth and seal your lips tightly around it. 5. Breathe in slowly and as deeply as possible, raising the piston or the ball toward the top of the column. 6. Hold your breath for 3-5 seconds or for as long as possible. Allow the piston or ball to fall to the bottom of the column. 7. Remove the mouthpiece from your mouth and breathe out normally. 8. Rest for a few seconds and repeat Steps 1 through 7 at least 10 times every 1-2 hours when you are awake. Take your time and take a few normal breaths between deep breaths. 9. The spirometer may include an indicator to show your best effort. Use the indicator as a goal to work toward during each repetition. 10. After each set of 10 deep  breaths, practice coughing to be sure your lungs are clear. If you have an incision (the cut made at the time of surgery), support your incision when coughing by placing a pillow or rolled up towels firmly against it. Once you are able to get out of bed, walk around indoors and cough well. You may stop using the incentive spirometer when instructed by your caregiver.  RISKS AND COMPLICATIONS  Take your time so you do not get dizzy or light-headed.  If you are in pain, you may need to take or ask for pain medication before doing incentive spirometry. It is harder to take a deep breath if you  are having pain. AFTER USE  Rest and breathe slowly and easily.  It can be helpful to keep track of a log of your progress. Your caregiver can provide you with a simple table to help with this. If you are using the spirometer at home, follow these instructions: [place] IF:   You are having difficultly using the spirometer.  You have trouble using the spirometer as often as instructed.  Your pain medication is not giving enough relief while using the spirometer.  You develop fever of 100.5 F (38.1 C) or higher. SEEK IMMEDIATE MEDICAL CARE IF:   You cough up bloody sputum that had not been present before.  You develop fever of 102 F (38.9 C) or greater.  You develop worsening pain at or near the incision site. MAKE SURE YOU:   Understand these instructions.  Will watch your condition.  Will get help right away if you are not doing well or get worse. Document Released: 11/25/2006 Document Revised: 10/07/2011 Document Reviewed: 01/26/2007 [facility] Patient Information 2014 [place], [place].   ________________________________________________________________________

## 2020-08-22 ENCOUNTER — Other Ambulatory Visit: Payer: Self-pay

## 2020-08-22 ENCOUNTER — Telehealth: Payer: Self-pay | Admitting: *Deleted

## 2020-08-22 ENCOUNTER — Encounter (HOSPITAL_COMMUNITY)
Admission: RE | Admit: 2020-08-22 | Discharge: 2020-08-22 | Disposition: A | Discharge status: Home / Self Care | Payer: Medicare PPO | Source: Ambulatory Visit | Attending: Orthopaedic Surgery | Admitting: Orthopaedic Surgery

## 2020-08-22 ENCOUNTER — Encounter (HOSPITAL_COMMUNITY): Payer: Self-pay

## 2020-08-22 ENCOUNTER — Other Ambulatory Visit (HOSPITAL_COMMUNITY)
Admission: RE | Admit: 2020-08-22 | Discharge: 2020-08-22 | Disposition: A | Discharge status: Home / Self Care | Payer: Medicare PPO | Source: Ambulatory Visit | Attending: Orthopaedic Surgery | Admitting: Orthopaedic Surgery

## 2020-08-22 DIAGNOSIS — Z20822 Contact with and (suspected) exposure to covid-19: Secondary | ICD-10-CM | POA: Diagnosis not present

## 2020-08-22 DIAGNOSIS — Z01818 Encounter for other preprocedural examination: Secondary | ICD-10-CM | POA: Diagnosis not present

## 2020-08-22 HISTORY — DX: Unspecified osteoarthritis, unspecified site: M19.90

## 2020-08-22 HISTORY — DX: Personal history of urinary calculi: Z87.442

## 2020-08-22 LAB — BASIC METABOLIC PANEL
Anion gap: 10 (ref 5–15)
BUN: 19 mg/dL (ref 8–23)
CO2: 25 mmol/L (ref 22–32)
Calcium: 9.3 mg/dL (ref 8.9–10.3)
Chloride: 104 mmol/L (ref 98–111)
Creatinine, Ser: 0.9 mg/dL (ref 0.61–1.24)
GFR, Estimated: >60 mL/min (ref >60)
Glucose, Bld: 110 mg/dL — ABNORMAL HIGH (ref 70–99)
Potassium: 4.6 mmol/L (ref 3.5–5.1)
Sodium: 139 mmol/L (ref 135–145)

## 2020-08-22 LAB — SARS CORONAVIRUS 2 (TAT 6-24 HRS): SARS Coronavirus 2: NEGATIVE

## 2020-08-22 LAB — SURGICAL PCR SCREEN
MRSA, PCR: NEGATIVE
Staphylococcus aureus: NEGATIVE

## 2020-08-22 LAB — CBC
HCT: 46.9 % (ref 39.0–52.0)
Hemoglobin: 16 g/dL (ref 13.0–17.0)
MCH: 31.7 pg (ref 26.0–34.0)
MCHC: 34.1 g/dL (ref 30.0–36.0)
MCV: 92.9 fL (ref 80.0–100.0)
Platelets: 204 10*3/uL (ref 150–400)
RBC: 5.05 MIL/uL (ref 4.22–5.81)
RDW: 12 % (ref 11.5–15.5)
WBC: 9.2 10*3/uL (ref 4.0–10.5)
nRBC: 0 % (ref 0.0–0.2)

## 2020-08-22 NOTE — Telephone Encounter (Signed)
Ortho bundle pre-op call completed. 

## 2020-08-22 NOTE — Care Plan (Signed)
RNCM call to patient to discuss his upcoming Right total hip replacement with Dr. Magnus Ivan on Friday, 08/25/20. He is an Ortho bundle patient, who will be a same day discharge. He is agreeable to case management. He has a FWW as well as elevated toilet seats in his home. No DME needed. Anticipate HHPT will be needed after discharge. [facility] after choice provided. Reviewed all post-op care instructions. Will continue to follow for needs.

## 2020-08-24 NOTE — H&P (Signed)
TOTAL HIP ADMISSION H&P  Patient is admitted for right total hip arthroplasty.  Subjective:  Chief Complaint: right hip pain  HPI: Gabriel Mccormick, 70 y.o. male, has a history of pain and functional disability in the right hip(s) due to arthritis and patient has failed non-surgical conservative treatments for greater than 12 weeks to include NSAID's and/or analgesics, corticosteriod injections, flexibility and strengthening excercises, use of assistive devices, weight reduction as appropriate and activity modification.  Onset of symptoms was gradual starting 3 years ago with gradually worsening course since that time.The patient noted no past surgery on the right hip(s).  Patient currently rates pain in the right hip at 10 out of 10 with activity. Patient has night pain, worsening of pain with activity and weight bearing, pain that interfers with activities of daily living and pain with passive range of motion. Patient has evidence of subchondral cysts, subchondral sclerosis, periarticular osteophytes and joint space narrowing by imaging studies. This condition presents safety issues increasing the risk of falls.  There is no current active infection.  Patient Active Problem List   Diagnosis Date Noted  . Hypertension 06/11/2018  . Pain in right hip 07/31/2017  . Unilateral primary osteoarthritis, right hip 07/31/2017  . Allergic rhinitis 12/27/2014  . Personal history of disease of skin and subcutaneous tissue 12/27/2014  . History of colon polyps 12/27/2014  . Hyperlipidemia 12/27/2014  . Arthritis, degenerative 12/27/2014   Past Medical History:  Diagnosis Date  . Arthritis    hip  . History of kidney stones   . Hyperlipidemia   . Hypertension     Past Surgical History:  Procedure Laterality Date  . HERNIA REPAIR    . MANDIBLE SURGERY     asymmetry corrected    wires in place    No current facility-administered medications for this encounter.   Current Outpatient  Medications  Medication Sig Dispense Refill Last Dose  . amLODipine (NORVASC) 5 MG tablet TAKE ONE TABLET BY MOUTH EVERY DAY (Patient taking differently: Take 5 mg by mouth daily.) 90 tablet 1   . aspirin 81 MG tablet Take 81 mg by mouth daily.      . diflunisal (DOLOBID) 500 MG TABS tablet Take 1 tablet (500 mg total) by mouth 2 (two) times daily as needed. (Patient taking differently: No sig reported) 60 tablet 1   . ibuprofen (ADVIL,MOTRIN) 200 MG tablet Take 400 mg by mouth every 6 (six) hours as needed for moderate pain.     Marland Kitchen MULTIPLE VITAMIN PO Take 1 tablet by mouth daily.     . Omega-3 Fatty Acids (FISH OIL) 1000 MG CAPS Take 1,000 mg by mouth daily.     . rosuvastatin (CRESTOR) 10 MG tablet TAKE ONE TABLET EVERY DAY (Patient taking differently: Take 10 mg by mouth daily.) 90 tablet 1    No Known Allergies  Social History   Tobacco Use  . Smoking status: Never Smoker  . Smokeless tobacco: Never Used  Substance Use Topics  . Alcohol use: Yes    Alcohol/week: 0.0 standard drinks    Comment: maybe 3 drinks a month / beer or wine    Family History  Problem Relation Age of Onset  . GER disease Mother   . Dementia Mother   . Lung cancer Father   . Cancer Maternal Grandfather   . Diabetes Paternal Grandmother   . Heart attack Paternal Grandmother   . Leukemia Paternal Grandfather   . Diabetes Maternal Grandmother  Review of Systems  Musculoskeletal: Positive for gait problem.  All other systems reviewed and are negative.   Objective:  Physical Exam Vitals reviewed.  Constitutional:      Appearance: Normal appearance.  HENT:     Head: Normocephalic and atraumatic.  Eyes:     Extraocular Movements: Extraocular movements intact.     Pupils: Pupils are equal, round, and reactive to light.  Cardiovascular:     Rate and Rhythm: Normal rate.     Pulses: Normal pulses.  Pulmonary:     Effort: Pulmonary effort is normal.  Abdominal:     Palpations: Abdomen is soft.   Musculoskeletal:     Cervical back: Normal range of motion.     Right hip: Tenderness and bony tenderness present. Decreased range of motion. Decreased strength.  Neurological:     Mental Status: He is alert and oriented to person, place, and time.  Psychiatric:        Behavior: Behavior normal.     Vital signs in last 24 hours:    Labs:   Estimated body mass index is 29.29 kg/m as calculated from the following:   Height as of 08/22/20: 5\' 11"  (1.803 m).   Weight as of 08/22/20: 95.3 kg.   Imaging Review Plain radiographs demonstrate severe degenerative joint disease of the right hip(s). The bone quality appears to be good for age and reported activity level.      Assessment/Plan:  End stage arthritis, right hip(s)  The patient history, physical examination, clinical judgement of the provider and imaging studies are consistent with end stage degenerative joint disease of the right hip(s) and total hip arthroplasty is deemed medically necessary. The treatment options including medical management, injection therapy, arthroscopy and arthroplasty were discussed at length. The risks and benefits of total hip arthroplasty were presented and reviewed. The risks due to aseptic loosening, infection, stiffness, dislocation/subluxation,  thromboembolic complications and other imponderables were discussed.  The patient acknowledged the explanation, agreed to proceed with the plan and consent was signed. Patient is being admitted for inpatient treatment for surgery, pain control, PT, OT, prophylactic antibiotics, VTE prophylaxis, progressive ambulation and ADL's and discharge planning.The patient is planning to be discharged home with home health services

## 2020-08-24 NOTE — Anesthesia Preprocedure Evaluation (Signed)
Anesthesia Evaluation  Patient identified by MRN, date of birth, ID band Patient awake    Reviewed: Allergy & Precautions, NPO status , Patient's Chart, lab work & pertinent test results  History of Anesthesia Complications Negative for: history of anesthetic complications  Airway Mallampati: II  TM Distance: >3 FB Neck ROM: Full    Dental no notable dental hx.    Pulmonary neg pulmonary ROS,    Pulmonary exam normal        Cardiovascular hypertension, Pt. on medications Normal cardiovascular exam     Neuro/Psych negative neurological ROS  negative psych ROS   GI/Hepatic negative GI ROS, Neg liver ROS,   Endo/Other  negative endocrine ROS  Renal/GU negative Renal ROS  negative genitourinary   Musculoskeletal  (+) Arthritis ,   Abdominal   Peds  Hematology negative hematology ROS (+)   Anesthesia Other Findings Day of surgery medications reviewed with patient.  Reproductive/Obstetrics negative OB ROS                            Anesthesia Physical Anesthesia Plan  ASA: II  Anesthesia Plan: Spinal   Post-op Pain Management:    Induction:   PONV Risk Score and Plan: 2 and Treatment may vary due to age or medical condition, Ondansetron, Propofol infusion and Dexamethasone  Airway Management Planned: Natural Airway and Simple Face Mask  Additional Equipment: None  Intra-op Plan:   Post-operative Plan:   Informed Consent: I have reviewed the patients History and Physical, chart, labs and discussed the procedure including the risks, benefits and alternatives for the proposed anesthesia with the patient or authorized representative who has indicated his/her understanding and acceptance.       Plan Discussed with: CRNA  Anesthesia Plan Comments:        Anesthesia Quick Evaluation

## 2020-08-25 ENCOUNTER — Ambulatory Visit (HOSPITAL_COMMUNITY): Payer: Medicare PPO

## 2020-08-25 ENCOUNTER — Encounter (HOSPITAL_COMMUNITY): Payer: Self-pay | Admitting: Orthopaedic Surgery

## 2020-08-25 ENCOUNTER — Ambulatory Visit (HOSPITAL_COMMUNITY)
Admission: RE | Admit: 2020-08-25 | Discharge: 2020-08-25 | Disposition: A | Discharge status: Home / Self Care | Payer: Medicare PPO | Attending: Orthopaedic Surgery | Admitting: Orthopaedic Surgery

## 2020-08-25 ENCOUNTER — Encounter (HOSPITAL_COMMUNITY)
Admission: RE | Disposition: A | Discharge status: Home / Self Care | Payer: Self-pay | Source: Home / Self Care | Attending: Orthopaedic Surgery

## 2020-08-25 ENCOUNTER — Other Ambulatory Visit: Payer: Self-pay | Admitting: Orthopaedic Surgery

## 2020-08-25 ENCOUNTER — Ambulatory Visit (HOSPITAL_COMMUNITY): Payer: Medicare PPO | Admitting: Anesthesiology

## 2020-08-25 DIAGNOSIS — Z7982 Long term (current) use of aspirin: Secondary | ICD-10-CM | POA: Diagnosis not present

## 2020-08-25 DIAGNOSIS — I10 Essential (primary) hypertension: Secondary | ICD-10-CM | POA: Diagnosis not present

## 2020-08-25 DIAGNOSIS — Z79899 Other long term (current) drug therapy: Secondary | ICD-10-CM | POA: Insufficient documentation

## 2020-08-25 DIAGNOSIS — M25751 Osteophyte, right hip: Secondary | ICD-10-CM | POA: Diagnosis not present

## 2020-08-25 DIAGNOSIS — Z86010 Personal history of colonic polyps: Secondary | ICD-10-CM | POA: Diagnosis not present

## 2020-08-25 DIAGNOSIS — E785 Hyperlipidemia, unspecified: Secondary | ICD-10-CM | POA: Diagnosis not present

## 2020-08-25 DIAGNOSIS — Z96641 Presence of right artificial hip joint: Secondary | ICD-10-CM

## 2020-08-25 DIAGNOSIS — Z471 Aftercare following joint replacement surgery: Secondary | ICD-10-CM | POA: Diagnosis not present

## 2020-08-25 DIAGNOSIS — M1611 Unilateral primary osteoarthritis, right hip: Secondary | ICD-10-CM | POA: Diagnosis present

## 2020-08-25 DIAGNOSIS — Z419 Encounter for procedure for purposes other than remedying health state, unspecified: Secondary | ICD-10-CM

## 2020-08-25 HISTORY — PX: TOTAL HIP ARTHROPLASTY: SHX124

## 2020-08-25 LAB — TYPE AND SCREEN
ABO/RH(D): A POS
Antibody Screen: NEGATIVE

## 2020-08-25 LAB — ABO/RH: ABO/RH(D): A POS

## 2020-08-25 SURGERY — ARTHROPLASTY, HIP, TOTAL, ANTERIOR APPROACH
Anesthesia: Spinal | Site: Hip | Laterality: Right

## 2020-08-25 MED ORDER — CEFAZOLIN SODIUM-DEXTROSE 2-4 GM/100ML-% IV SOLN
2.0000 g | INTRAVENOUS | Status: AC
  Administered 2020-08-25: 2 g via INTRAVENOUS
  Filled 2020-08-25: qty 100

## 2020-08-25 MED ORDER — TAMSULOSIN HCL 0.4 MG PO CAPS
0.4000 mg | ORAL_CAPSULE | Freq: Every day | ORAL | Status: DC
  Filled 2020-08-25: qty 1

## 2020-08-25 MED ORDER — SODIUM CHLORIDE 0.9 % IR SOLN
Status: DC | PRN
  Administered 2020-08-25: 1000 mL

## 2020-08-25 MED ORDER — ACETAMINOPHEN 500 MG PO TABS
1000.0000 mg | ORAL_TABLET | Freq: Once | ORAL | Status: AC
  Administered 2020-08-25: 1000 mg via ORAL
  Filled 2020-08-25: qty 2

## 2020-08-25 MED ORDER — ONDANSETRON HCL 4 MG/2ML IJ SOLN
INTRAMUSCULAR | Status: DC | PRN
  Administered 2020-08-25: 4 mg via INTRAVENOUS

## 2020-08-25 MED ORDER — LACTATED RINGERS IV SOLN: INTRAVENOUS | Status: DC

## 2020-08-25 MED ORDER — STERILE WATER FOR IRRIGATION IR SOLN
Status: DC | PRN
  Administered 2020-08-25: 2000 mL

## 2020-08-25 MED ORDER — PROPOFOL 500 MG/50ML IV EMUL
INTRAVENOUS | Status: DC | PRN
  Administered 2020-08-25: 40 ug/kg/min via INTRAVENOUS

## 2020-08-25 MED ORDER — FENTANYL CITRATE (PF) 100 MCG/2ML IJ SOLN
INTRAMUSCULAR | Status: AC
  Filled 2020-08-25: qty 2

## 2020-08-25 MED ORDER — ORAL CARE MOUTH RINSE: 15.0000 mL | Freq: Once | OROMUCOSAL | Status: AC

## 2020-08-25 MED ORDER — LACTATED RINGERS IV BOLUS
250.0000 mL | Freq: Once | INTRAVENOUS | Status: AC
  Administered 2020-08-25: 250 mL via INTRAVENOUS

## 2020-08-25 MED ORDER — OXYCODONE HCL 5 MG PO TABS
5.0000 mg | ORAL_TABLET | Freq: Once | ORAL | Status: AC | PRN
  Administered 2020-08-25: 5 mg via ORAL

## 2020-08-25 MED ORDER — TRANEXAMIC ACID-NACL 1000-0.7 MG/100ML-% IV SOLN
1000.0000 mg | INTRAVENOUS | Status: AC
  Administered 2020-08-25: 1000 mg via INTRAVENOUS
  Filled 2020-08-25: qty 100

## 2020-08-25 MED ORDER — BUPIVACAINE HCL 0.25 % IJ SOLN
INTRAMUSCULAR | Status: AC
  Filled 2020-08-25: qty 1

## 2020-08-25 MED ORDER — POVIDONE-IODINE 10 % EX SWAB
2.0000 "application " | Freq: Once | CUTANEOUS | Status: AC
  Administered 2020-08-25: 2 via TOPICAL

## 2020-08-25 MED ORDER — MIDAZOLAM HCL 2 MG/2ML IJ SOLN
INTRAMUSCULAR | Status: AC
  Filled 2020-08-25: qty 2

## 2020-08-25 MED ORDER — FENTANYL CITRATE (PF) 100 MCG/2ML IJ SOLN
INTRAMUSCULAR | Status: DC | PRN
  Administered 2020-08-25: 25 ug via INTRAVENOUS
  Administered 2020-08-25: 50 ug via INTRAVENOUS
  Administered 2020-08-25: 25 ug via INTRAVENOUS

## 2020-08-25 MED ORDER — PROPOFOL 1000 MG/100ML IV EMUL
INTRAVENOUS | Status: AC
  Filled 2020-08-25: qty 100

## 2020-08-25 MED ORDER — 0.9 % SODIUM CHLORIDE (POUR BTL) OPTIME
TOPICAL | Status: DC | PRN
  Administered 2020-08-25: 1000 mL

## 2020-08-25 MED ORDER — LACTATED RINGERS IV BOLUS
500.0000 mL | Freq: Once | INTRAVENOUS | Status: AC
  Administered 2020-08-25: 500 mL via INTRAVENOUS

## 2020-08-25 MED ORDER — ASPIRIN 81 MG PO CHEW: 81.0000 mg | CHEWABLE_TABLET | Freq: Two times a day (BID) | ORAL | 0 refills | Status: AC

## 2020-08-25 MED ORDER — FENTANYL CITRATE (PF) 100 MCG/2ML IJ SOLN
25.0000 ug | INTRAMUSCULAR | Status: DC | PRN
Start: 2020-08-25 — End: 2020-08-25

## 2020-08-25 MED ORDER — BUPIVACAINE HCL (PF) 0.25 % IJ SOLN
INTRAMUSCULAR | Status: DC | PRN
  Administered 2020-08-25: 30 mL

## 2020-08-25 MED ORDER — PHENYLEPHRINE HCL (PRESSORS) 10 MG/ML IV SOLN
INTRAVENOUS | Status: AC
  Filled 2020-08-25: qty 2

## 2020-08-25 MED ORDER — ONDANSETRON 4 MG PO TBDP: 4.0000 mg | ORAL_TABLET | Freq: Three times a day (TID) | ORAL | 0 refills | Status: DC | PRN

## 2020-08-25 MED ORDER — DEXAMETHASONE SODIUM PHOSPHATE 10 MG/ML IJ SOLN
INTRAMUSCULAR | Status: AC
  Filled 2020-08-25: qty 1

## 2020-08-25 MED ORDER — ONDANSETRON HCL 4 MG/2ML IJ SOLN
INTRAMUSCULAR | Status: AC
  Filled 2020-08-25: qty 2

## 2020-08-25 MED ORDER — MIDAZOLAM HCL 5 MG/5ML IJ SOLN
INTRAMUSCULAR | Status: DC | PRN
  Administered 2020-08-25: 2 mg via INTRAVENOUS

## 2020-08-25 MED ORDER — OXYCODONE HCL 5 MG PO TABS
ORAL_TABLET | ORAL | Status: AC
  Filled 2020-08-25: qty 1

## 2020-08-25 MED ORDER — METHOCARBAMOL 500 MG PO TABS: 500.0000 mg | ORAL_TABLET | Freq: Four times a day (QID) | ORAL | 1 refills | Status: DC | PRN

## 2020-08-25 MED ORDER — CHLORHEXIDINE GLUCONATE 0.12 % MT SOLN
15.0000 mL | Freq: Once | OROMUCOSAL | Status: AC
  Administered 2020-08-25: 15 mL via OROMUCOSAL

## 2020-08-25 MED ORDER — BUPIVACAINE IN DEXTROSE 0.75-8.25 % IT SOLN
INTRATHECAL | Status: DC | PRN
  Administered 2020-08-25: 1.8 mL via INTRATHECAL

## 2020-08-25 MED ORDER — OXYCODONE HCL 5 MG/5ML PO SOLN: 5.0000 mg | Freq: Once | ORAL | Status: AC | PRN

## 2020-08-25 MED ORDER — OXYCODONE HCL 5 MG PO TABS: 5.0000 mg | ORAL_TABLET | ORAL | 0 refills | Status: AC | PRN

## 2020-08-25 MED ORDER — PROMETHAZINE HCL 25 MG/ML IJ SOLN: 6.2500 mg | INTRAMUSCULAR | Status: DC | PRN

## 2020-08-25 SURGICAL SUPPLY — 36 items
BAG ZIPLOCK 12X15 (MISCELLANEOUS) IMPLANT
BLADE SAW SGTL 18X1.27X75 (BLADE) ×2 IMPLANT
COVER PERINEAL POST (MISCELLANEOUS) ×2 IMPLANT
COVER SURGICAL LIGHT HANDLE (MISCELLANEOUS) ×2 IMPLANT
COVER WAND RF STERILE (DRAPES) ×2 IMPLANT
CUP ACET PNNCL SECTR W/GRIP 56 (Hips) ×1 IMPLANT
DRAPE STERI IOBAN 125X83 (DRAPES) ×2 IMPLANT
DRAPE U-SHAPE 47X51 STRL (DRAPES) ×4 IMPLANT
DRSG AQUACEL AG ADV 3.5X10 (GAUZE/BANDAGES/DRESSINGS) ×2 IMPLANT
DURAPREP 26ML APPLICATOR (WOUND CARE) ×2 IMPLANT
ELECT REM PT RETURN 15FT ADLT (MISCELLANEOUS) ×2 IMPLANT
GAUZE XEROFORM 1X8 LF (GAUZE/BANDAGES/DRESSINGS) ×2 IMPLANT
GLOVE BIO SURGEON STRL SZ7.5 (GLOVE) ×2 IMPLANT
GLOVE ECLIPSE 8.0 STRL XLNG CF (GLOVE) ×2 IMPLANT
GLOVE SRG 8 PF TXTR STRL LF DI (GLOVE) ×2 IMPLANT
GLOVE SURG UNDER POLY LF SZ8 (GLOVE) ×2
GOWN STRL REUS W/TWL XL LVL3 (GOWN DISPOSABLE) ×4 IMPLANT
HANDPIECE INTERPULSE COAX TIP (DISPOSABLE) ×1
HEAD CERAMIC 36 PLUS5 (Hips) ×2 IMPLANT
HOLDER FOLEY CATH W/STRAP (MISCELLANEOUS) ×2 IMPLANT
KIT TURNOVER KIT A (KITS) ×2 IMPLANT
PACK ANTERIOR HIP CUSTOM (KITS) ×2 IMPLANT
PENCIL SMOKE EVACUATOR (MISCELLANEOUS) ×2 IMPLANT
PINN SECTOR W/GRIP ACE CUP 56 (Hips) ×2 IMPLANT
PINNACLE ALTRX PLUS 4 N 36X56 (Hips) ×2 IMPLANT
SET HNDPC FAN SPRY TIP SCT (DISPOSABLE) ×1 IMPLANT
STAPLER VISISTAT 35W (STAPLE) ×2 IMPLANT
STEM CORAIL KA11 (Stem) ×2 IMPLANT
SUT ETHIBOND NAB CT1 #1 30IN (SUTURE) ×2 IMPLANT
SUT ETHILON 2 0 PS N (SUTURE) IMPLANT
SUT MNCRL AB 4-0 PS2 18 (SUTURE) IMPLANT
SUT VIC AB 0 CT1 36 (SUTURE) ×2 IMPLANT
SUT VIC AB 1 CT1 36 (SUTURE) ×2 IMPLANT
SUT VIC AB 2-0 CT1 27 (SUTURE) ×2
SUT VIC AB 2-0 CT1 TAPERPNT 27 (SUTURE) ×2 IMPLANT
TRAY FOLEY MTR SLVR 16FR STAT (SET/KITS/TRAYS/PACK) ×2 IMPLANT

## 2020-08-25 NOTE — Discharge Instructions (Signed)

## 2020-08-25 NOTE — Brief Op Note (Signed)
08/25/2020  8:36 AM  PATIENT:  Gabriel Mccormick  70 y.o. male  PRE-OPERATIVE DIAGNOSIS:  osteoarthritis right hip  POST-OPERATIVE DIAGNOSIS:  osteoarthritis right hip  PROCEDURE:  Procedure(s) with comments: RIGHT TOTAL HIP ARTHROPLASTY ANTERIOR APPROACH (Right) - 3E bed  SURGEON:  Surgeon(s) and Role:    Kathryne Hitch, MD - Primary  PHYSICIAN ASSISTANT:  Rexene Edison, PA-C  ANESTHESIA:   local and spinal  EBL:  200 mL   COUNTS:  YES  DICTATION: .Other Dictation: Dictation Number [id number]  PLAN OF CARE: Discharge to home after PACU  PATIENT DISPOSITION:  PACU - hemodynamically stable.   Delay start of Pharmacological VTE agent (>24hrs) due to surgical blood loss or risk of bleeding: no

## 2020-08-25 NOTE — Op Note (Signed)
NAME: Gabriel Mccormick, Gabriel Mccormick [facility] MEDICAL RECORD [id number] ACCOUNT [id number] DATE OF BIRTH:09-Oct-1950 FACILITY: [facility] LOCATION: [facility] PHYSICIAN:Raimundo Corbit Aretha Parrot, MD  OPERATIVE REPORT  DATE OF PROCEDURE:  08/25/2020  PREOPERATIVE DIAGNOSIS:  Primary osteoarthritis and degenerative joint disease, right hip.  POSTOPERATIVE DIAGNOSIS:  Primary osteoarthritis and degenerative joint disease, right hip.  PROCEDURE:  Right total hip arthroplasty through direct anterior approach.  IMPLANTS:  DePuy Sector Gription acetabular component size 56, size 36+4 neutral polyethylene liner, size 11 Corail femoral component with standard offset, size 36+5 ceramic hip ball.  SURGEON:  Vanita Panda. Magnus Ivan, MD  ASSISTANT:  Richardean Canal, PA-C.  ANESTHESIA:  Spinal.  ANTIBIOTICS:  Two grams IV Ancef.  ESTIMATED BLOOD LOSS:  200 mL.  COMPLICATIONS:  None.  INDICATIONS:  The patient is a 70 year old gentleman I have actually known for several years now.  He has had well-documented debilitating arthritis that is end-stage of his right hip.  At this point, is detrimentally affecting his mobility, his quality  of life and his activities of daily living to the point he does wish to proceed with a total hip arthroplasty on the right side.  We had a long and thorough discussion about the risk of acute blood loss anemia, nerve or vessel injury, fracture,  infection, dislocation, DVT, implant failure and skin and soft tissue issues.  We talked about our goals being decreased pain, improve mobility and overall improve quality of life.  DESCRIPTION OF PROCEDURE:  After informed consent was obtained and appropriate right hip was marked.  He was brought to the operating room and sat up on the stretcher where spinal anesthesia was obtained.  He was then laid in supine position on a  stretcher.  Foley catheter was placed.  We were able to get a good assessment of his leg lengths.  He does feel shorter on  the right and clinically he is definitely short on the right, although his radiographs make it look like standing that he is  longer.  We then next placed him supine on the Hana fracture table, the perineal post in place and both legs in line skeletal traction device and no traction applied.  I then assessed his hip, leg lengths again to get a good preoperative film with him  lying supine and still it looks like he is longer on the right than the left, but we know he is clinically shorter, so we definitely will compensate for that intraoperative x-rays wise.  His right operative hip was then prepped and draped with DuraPrep  and sterile drapes.  A timeout was called.  He was identified as correct patient, correct right hip.  I then made an incision just inferior and posterior to the anterior superior iliac spine and carried this slightly obliquely down the leg.  I dissected  down tensor fascia lata muscle.  Tensor fascia was then divided longitudinally to proceed with direct anterior approach to the hip.  I identified and cauterized circumflex vessels and identified the hip capsule, opened up the hip capsule in an L-type  format, finding moderate joint effusion and significant large periarticular osteophytes around the lateral acetabulum and femoral head and neck.  I removed those as well.  I then placed Cobra retractors around the medial and lateral femoral neck and made  our femoral neck cut with an oscillating saw just proximal to the lesser trochanter.  We completed this with an osteotome.  I placed a corkscrew guide in the femoral head and removed the femoral head in  its entirety and found a wide area devoid of  cartilage _____ not spherical surface.  I then placed a bent Hohmann over the medial acetabular rim and removed remnants of acetabular labrum and other debris including periarticular osteophytes.  We then began reaming under direct visualization from a  size 44 reamer in stepwise increments  going up to a size 55 with all reamers placed under direct visualization, the last reamer was placed under direct fluoroscopy, so we could obtain our depth of reaming, our inclination and anteversion.  I then placed  the real DePuy Sector Gription acetabular component size 56, we went with a 36+4 neutral polyethylene liner for having him being more medialized.  Attention was then turned to the femur.  With the leg externally rotated to 120 degrees, extended and  adducted, we are able to place a Mueller retractor medially and Hohman retractor behind the greater trochanter.  We released lateral joint capsule and used a box-cutting osteotome to enter the femoral canal and a rongeur to lateralize, then began  broaching using the Corail broaching system from a size 8 going up to a size 11.  With a size 11 in place, we trialed a standard offset femoral neck and a 36+1.5 hip ball, reduced this in the acetabulum and it was stable on my exam, but I did feel like  he needed just a little bit more offset and leg length.  We dislocated the hip and removed the trial components.  We then placed the real Corail femoral component with standard offset size 11 and we went with a 36+5 ceramic hip ball and again reduced  this in the acetabulum.  We were pleased with stability, leg length, offset and range of motion assessed mechanically and radiographically.  Again, radiographically looks like he is significantly longer than the other side, but we know clinically we  matched his other side leg length wise and is definitely stable and tight on exam.  We then irrigated the soft tissue with normal saline solution using pulsatile lavage.  We closed the joint capsule with interrupted #1 Ethibond suture, followed by #1  Vicryl to close the tensor fascia, 0 Vicryl was used to close deep tissue, 2-0 Vicryl was used to close the subcutaneous tissue.  Staples were used to reapproximate the skin.  A 0.25% plain Marcaine was placed around  the incision as well.  An Aquacel  dressing was applied.  He was taken off the Hana table and taken to recovery room in stable condition with all final counts being correct.  There were no complications noted.  Of note, Rexene Edison, PA-C's assistance was valuable throughout the entire case  from the beginning to end and was definitely needed throughout the crucial aspects of this case as well.  HN/NUANCE  D:08/25/2020 T:08/25/2020 JOB:[id number]

## 2020-08-25 NOTE — Transfer of Care (Signed)
Immediate Anesthesia Transfer of Care Note  Patient: Gabriel Mccormick  Procedure(s) Performed: RIGHT TOTAL HIP ARTHROPLASTY ANTERIOR APPROACH (Right Hip)  Patient Location: PACU  Anesthesia Type:MAC and Spinal  Level of Consciousness: awake, alert , oriented and patient cooperative  Airway & Oxygen Therapy: Patient Spontanous Breathing and Patient connected to face mask oxygen  Post-op Assessment: Report given to RN and Post -op Vital signs reviewed and stable  Post vital signs: Reviewed and stable  Last Vitals:  Vitals Value Taken Time  BP    Temp    Pulse 57 08/25/20 0852  Resp 12 08/25/20 0852  SpO2 99 % 08/25/20 0852  Vitals shown include unvalidated device data.  Last Pain:  Vitals:   08/25/20 0600  TempSrc: Oral  PainSc:       Patients Stated Pain Goal: 3 (85/27/78 2423)  Complications: No complications documented.

## 2020-08-25 NOTE — Care Plan (Signed)
Ortho Bundle Case Management Note  Patient Details  Name: Niel Peretti MRN: [id number] Date of Birth: 12-Oct-1950    RNCM call to patient to discuss his upcoming Right total hip replacement with Dr. Magnus Ivan on Friday, 08/25/20. He is an Ortho bundle patient, who will be a same day discharge. He is agreeable to case management. He has a FWW as well as elevated toilet seats in his home. No DME needed. Anticipate HHPT will be needed after discharge. Referral made to [facility] after choice provided. Reviewed all post-op care instructions. Will continue to follow for needs.                DME Arranged:   (Patient verbalized he has a FWW in home already; elevated toilet seats as well. NO DME NEEDED>) DME Agency:     HH Arranged:  PT HH Agency:  [facility] (now [facility])  Additional Comments: Please contact me with any questions of if this plan should need to change.  Ralph Dowdy, RN, BSN, [employer]  [phone] 08/25/2020, 10:50 AM

## 2020-08-25 NOTE — Anesthesia Procedure Notes (Signed)
Spinal  Patient location during procedure: OR Start time: 08/25/2020 7:23 AM End time: 08/25/2020 7:26 AM Staffing Performed: anesthesiologist  Anesthesiologist: Kaylyn Layer, MD Preanesthetic Checklist Completed: patient identified, IV checked, risks and benefits discussed, surgical consent, monitors and equipment checked, pre-op evaluation and timeout performed Spinal Block Patient position: sitting Prep: DuraPrep and site prepped and draped Patient monitoring: continuous pulse ox, blood pressure and heart rate Approach: midline Location: L3-4 Injection technique: single-shot Needle Needle type: Pencan  Needle gauge: 24 G Needle length: 9 cm Additional Notes Risks, benefits, and alternative discussed. Patient gave consent to procedure. Prepped and draped in sitting position. Patient sedated but responsive to voice. Clear CSF obtained after one needle redirection. Positive terminal aspiration. No pain or paraesthesias with injection. Patient tolerated procedure well. Vital signs stable. Amalia Greenhouse, MD

## 2020-08-25 NOTE — Anesthesia Postprocedure Evaluation (Signed)
Anesthesia Post Note  Patient: Gabriel Mccormick  Procedure(s) Performed: RIGHT TOTAL HIP ARTHROPLASTY ANTERIOR APPROACH (Right Hip)     Patient location during evaluation: PACU Anesthesia Type: Spinal Level of consciousness: awake and alert and oriented Pain management: pain level controlled Vital Signs Assessment: post-procedure vital signs reviewed and stable Respiratory status: spontaneous breathing, nonlabored ventilation and respiratory function stable Cardiovascular status: blood pressure returned to baseline Postop Assessment: no apparent nausea or vomiting and spinal receding Anesthetic complications: no   No complications documented.  Last Vitals:  Vitals:   08/25/20 1015 08/25/20 1035  BP: 99/74 118/72  Pulse: 62 62  Resp: 16 16  Temp: 36.6 C 36.6 C  SpO2: 100% 100%    Last Pain:  Vitals:   08/25/20 1035  TempSrc:   PainSc: 3                  Kaylyn Layer

## 2020-08-25 NOTE — Evaluation (Signed)
Physical Therapy Evaluation Patient Details Name: Gabriel Mccormick MRN: [id number] DOB: 24-Aug-1950 Today's Date: 08/25/2020   History of Present Illness  patient is a  70 yo male s/p R THA on 08/25/2020 with PMH significant for HTN, HLD, and OA.  Clinical Impression  Pt is a 70yo male s/p Rt THA POD 0. Pt reports that he is independent with mobility at baseline. Pt required MIN guard and verbal cues for sit to stand transfers. Pt required MIN guard progressing to supervision for ambulation 100' with verbal cues for RW management and step to gait pattern, pt progressed to step through pattern with no LOB. Pt was able to safely perform stair negotiation with MIN assist and cues for sequencing. Pt was able to verbalize safe guarding position for family members when assisting at home. Pt's wife is available for supervision at home and pt's son is staying for 2 weeks to assist. Pt is at a safe mobility level for discharge. Recommend home with family support. Pt will benefit from skilled PT to increase independence and safety with mobility.      Follow Up Recommendations Home health PT;Follow surgeon's recommendation for DC plan and follow-up therapies    Equipment Recommendations  None recommended by PT (pt owns RW - 2 wheels)    Recommendations for Other Services       Precautions / Restrictions Precautions Precautions: Fall Restrictions Weight Bearing Restrictions: No Other Position/Activity Restrictions: WBAT      Mobility  Bed Mobility Overal bed mobility: Modified Independent             General bed mobility comments: pt with use of bed rail and B UEs to scoot to EOB    Transfers Overall transfer level: Needs assistance Equipment used: Rolling walker (2 wheeled) Transfers: Sit to/from Stand Sit to Stand: Min guard         General transfer comment: MIN guard for safety and cues for safe hand placement  Ambulation/Gait Ambulation/Gait assistance: Min  guard;Supervision Gait Distance (Feet): 100 Feet Assistive device: Rolling walker (2 wheeled) Gait Pattern/deviations: Step-to pattern;Step-through pattern;Decreased stride length;Decreased weight shift to right Gait velocity: fair   General Gait Details: Pt with MIN assist- supervision for safety and RW management, cues for step to gait pattern progressing to step through with no observed LOB.  Stairs Stairs: Yes Stairs assistance: Min assist Stair Management: No rails;Forwards;With walker Number of Stairs: 3 General stair comments: pt required MIN assist for RW management and safety with cues for sequencing. Pt was able to verbalize safe guarding positon for family members.  Wheelchair Mobility    Modified Rankin (Stroke Patients Only)       Balance Overall balance assessment: Needs assistance Sitting-balance support: Feet supported Sitting balance-Leahy Scale: Good     Standing balance support: Bilateral upper extremity supported;Single extremity supported Standing balance-Leahy Scale: Poor Standing balance comment: pt stood with single UE support on RW while holding urinal in standing with LEs supported up against bed.                             Pertinent Vitals/Pain Pain Assessment: 0-10 Pain Score: 3  Pain Location: Rt hip Pain Descriptors / Indicators: Sore;Burning Pain Intervention(s): Limited activity within patient's tolerance;Monitored during session;Repositioned    Home Living Family/patient expects to be discharged to:: Private residence Living Arrangements: Spouse/significant other Available Help at Discharge: Family Type of Home: House Home Access: Stairs to enter Entrance Stairs-Rails: None Entrance  Stairs-Number of Steps: 2 Home Layout: One level Home Equipment: Walker - 2 wheels;Cane - single point;Shower seat;Grab bars - tub/shower Additional Comments: pt's wife will be available at home for supervision. Pt's son is visiting for 2  weeks and is physically able to provide assistance when needed.    Prior Function Level of Independence: Independent               Hand Dominance   Dominant Hand: Right    Extremity/Trunk Assessment   Upper Extremity Assessment Upper Extremity Assessment: Overall WFL for tasks assessed    Lower Extremity Assessment Lower Extremity Assessment: RLE deficits/detail RLE Deficits / Details: pt with 4/5 R quad and DF/PF strength RLE Sensation: WNL RLE Coordination: WNL    Cervical / Trunk Assessment Cervical / Trunk Assessment: Normal  Communication   Communication: No difficulties  Cognition Arousal/Alertness: Awake/alert Behavior During Therapy: WFL for tasks assessed/performed Overall Cognitive Status: Within Functional Limits for tasks assessed                                        General Comments      Exercises Total Joint Exercises Ankle Circles/Pumps: AROM;Both;15 reps;Seated Quad Sets: AROM;Right;5 reps;Seated Short Arc Quad: AROM;Right;5 reps;Seated Heel Slides: AROM;Right;5 reps;Seated Hip ABduction/ADduction: AROM;Right;5 reps;Seated Long Arc Quad: AROM;Right;5 reps;Seated   Assessment/Plan    PT Assessment Patient needs continued PT services  PT Problem List Decreased strength;Decreased range of motion;Decreased activity tolerance;Decreased balance;Decreased mobility;Decreased knowledge of use of DME;Pain       PT Treatment Interventions DME instruction;Gait training;Stair training;Functional mobility training;Therapeutic activities;Therapeutic exercise;Balance training;Patient/family education    PT Goals (Current goals can be found in the Care Plan section)  Acute Rehab PT Goals Patient Stated Goal: return to golfing PT Goal Formulation: With patient Time For Goal Achievement: 09/01/20 Potential to Achieve Goals: Good    Frequency 7X/week   Barriers to discharge        Co-evaluation               AM-PAC PT "6  Clicks" Mobility  Outcome Measure Help needed turning from your back to your side while in a flat bed without using bedrails?: None Help needed moving from lying on your back to sitting on the side of a flat bed without using bedrails?: None Help needed moving to and from a bed to a chair (including a wheelchair)?: A Little Help needed standing up from a chair using your arms (e.g., wheelchair or bedside chair)?: A Little Help needed to walk in hospital room?: A Little Help needed climbing 3-5 steps with a railing? : A Little 6 Click Score: 20    End of Session Equipment Utilized During Treatment: Gait belt Activity Tolerance: Patient tolerated treatment well Patient left: in chair;with call bell/phone within reach Nurse Communication: Mobility status PT Visit Diagnosis: Unsteadiness on feet (R26.81);Muscle weakness (generalized) (M62.81);Pain Pain - part of body: Hip    Time: 1119-1200 PT Time Calculation (min) (ACUTE ONLY): 41 min   Charges:              Loyal Gambler, SPT  Acute rehab    Loyal Gambler 08/25/2020, 12:15 PM

## 2020-08-25 NOTE — Interval H&P Note (Signed)
History and Physical Interval Note: The patient understands fully that he is here today for a right total hip arthroplasty to treat the pain from his right hip osteoarthritis.  The risk and benefits of surgery been discussed in detail and informed consent is obtained.  There is been no acute changes in medical status.  See recent H&P.  The right hip has been marked.  08/25/2020 7:08 AM  Gabriel Mccormick  has presented today for surgery, with the diagnosis of osteoarthritis right hip.  The various methods of treatment have been discussed with the patient and family. After consideration of risks, benefits and other options for treatment, the patient has consented to  Procedure(s) with comments: RIGHT TOTAL HIP ARTHROPLASTY ANTERIOR APPROACH (Right) - 3E bed as a surgical intervention.  The patient's history has been reviewed, patient examined, no change in status, stable for surgery.  I have reviewed the patient's chart and labs.  Questions were answered to the patient's satisfaction.     Kathryne Hitch

## 2020-08-26 DIAGNOSIS — Z96641 Presence of right artificial hip joint: Secondary | ICD-10-CM | POA: Diagnosis not present

## 2020-08-26 DIAGNOSIS — K219 Gastro-esophageal reflux disease without esophagitis: Secondary | ICD-10-CM | POA: Diagnosis not present

## 2020-08-26 DIAGNOSIS — E785 Hyperlipidemia, unspecified: Secondary | ICD-10-CM | POA: Diagnosis not present

## 2020-08-26 DIAGNOSIS — Z7982 Long term (current) use of aspirin: Secondary | ICD-10-CM | POA: Diagnosis not present

## 2020-08-26 DIAGNOSIS — I10 Essential (primary) hypertension: Secondary | ICD-10-CM | POA: Diagnosis not present

## 2020-08-26 DIAGNOSIS — Z471 Aftercare following joint replacement surgery: Secondary | ICD-10-CM | POA: Diagnosis not present

## 2020-08-26 DIAGNOSIS — Z87442 Personal history of urinary calculi: Secondary | ICD-10-CM | POA: Diagnosis not present

## 2020-08-29 ENCOUNTER — Telehealth: Payer: Self-pay | Admitting: *Deleted

## 2020-08-29 ENCOUNTER — Other Ambulatory Visit: Payer: Self-pay | Admitting: Family Medicine

## 2020-08-29 DIAGNOSIS — Z87442 Personal history of urinary calculi: Secondary | ICD-10-CM | POA: Diagnosis not present

## 2020-08-29 DIAGNOSIS — Z7982 Long term (current) use of aspirin: Secondary | ICD-10-CM | POA: Diagnosis not present

## 2020-08-29 DIAGNOSIS — Z96641 Presence of right artificial hip joint: Secondary | ICD-10-CM | POA: Diagnosis not present

## 2020-08-29 DIAGNOSIS — Z471 Aftercare following joint replacement surgery: Secondary | ICD-10-CM | POA: Diagnosis not present

## 2020-08-29 DIAGNOSIS — I10 Essential (primary) hypertension: Secondary | ICD-10-CM | POA: Diagnosis not present

## 2020-08-29 DIAGNOSIS — E785 Hyperlipidemia, unspecified: Secondary | ICD-10-CM | POA: Diagnosis not present

## 2020-08-29 DIAGNOSIS — K219 Gastro-esophageal reflux disease without esophagitis: Secondary | ICD-10-CM | POA: Diagnosis not present

## 2020-08-29 NOTE — Telephone Encounter (Signed)
Ortho bundle D/C call completed. 

## 2020-08-31 DIAGNOSIS — Z96641 Presence of right artificial hip joint: Secondary | ICD-10-CM | POA: Diagnosis not present

## 2020-08-31 DIAGNOSIS — Z471 Aftercare following joint replacement surgery: Secondary | ICD-10-CM | POA: Diagnosis not present

## 2020-08-31 DIAGNOSIS — Z7982 Long term (current) use of aspirin: Secondary | ICD-10-CM | POA: Diagnosis not present

## 2020-08-31 DIAGNOSIS — K219 Gastro-esophageal reflux disease without esophagitis: Secondary | ICD-10-CM | POA: Diagnosis not present

## 2020-08-31 DIAGNOSIS — Z87442 Personal history of urinary calculi: Secondary | ICD-10-CM | POA: Diagnosis not present

## 2020-08-31 DIAGNOSIS — E785 Hyperlipidemia, unspecified: Secondary | ICD-10-CM | POA: Diagnosis not present

## 2020-08-31 DIAGNOSIS — I10 Essential (primary) hypertension: Secondary | ICD-10-CM | POA: Diagnosis not present

## 2020-09-01 ENCOUNTER — Telehealth: Payer: Self-pay | Admitting: *Deleted

## 2020-09-01 ENCOUNTER — Encounter (HOSPITAL_COMMUNITY): Payer: Self-pay | Admitting: Orthopaedic Surgery

## 2020-09-01 NOTE — Telephone Encounter (Signed)
Ortho bundle 7 day call to patient. 

## 2020-09-05 DIAGNOSIS — E785 Hyperlipidemia, unspecified: Secondary | ICD-10-CM | POA: Diagnosis not present

## 2020-09-05 DIAGNOSIS — Z87442 Personal history of urinary calculi: Secondary | ICD-10-CM | POA: Diagnosis not present

## 2020-09-05 DIAGNOSIS — I10 Essential (primary) hypertension: Secondary | ICD-10-CM | POA: Diagnosis not present

## 2020-09-05 DIAGNOSIS — Z96641 Presence of right artificial hip joint: Secondary | ICD-10-CM | POA: Diagnosis not present

## 2020-09-05 DIAGNOSIS — K219 Gastro-esophageal reflux disease without esophagitis: Secondary | ICD-10-CM | POA: Diagnosis not present

## 2020-09-05 DIAGNOSIS — Z7982 Long term (current) use of aspirin: Secondary | ICD-10-CM | POA: Diagnosis not present

## 2020-09-05 DIAGNOSIS — Z471 Aftercare following joint replacement surgery: Secondary | ICD-10-CM | POA: Diagnosis not present

## 2020-09-06 DIAGNOSIS — I10 Essential (primary) hypertension: Secondary | ICD-10-CM | POA: Diagnosis not present

## 2020-09-06 DIAGNOSIS — Z7982 Long term (current) use of aspirin: Secondary | ICD-10-CM | POA: Diagnosis not present

## 2020-09-06 DIAGNOSIS — Z96641 Presence of right artificial hip joint: Secondary | ICD-10-CM | POA: Diagnosis not present

## 2020-09-06 DIAGNOSIS — Z87442 Personal history of urinary calculi: Secondary | ICD-10-CM | POA: Diagnosis not present

## 2020-09-06 DIAGNOSIS — E785 Hyperlipidemia, unspecified: Secondary | ICD-10-CM | POA: Diagnosis not present

## 2020-09-06 DIAGNOSIS — Z471 Aftercare following joint replacement surgery: Secondary | ICD-10-CM | POA: Diagnosis not present

## 2020-09-06 DIAGNOSIS — K219 Gastro-esophageal reflux disease without esophagitis: Secondary | ICD-10-CM | POA: Diagnosis not present

## 2020-09-07 ENCOUNTER — Ambulatory Visit (INDEPENDENT_AMBULATORY_CARE_PROVIDER_SITE_OTHER): Payer: Medicare PPO | Admitting: Orthopaedic Surgery

## 2020-09-07 ENCOUNTER — Encounter: Payer: Self-pay | Admitting: Orthopaedic Surgery

## 2020-09-07 DIAGNOSIS — Z96641 Presence of right artificial hip joint: Secondary | ICD-10-CM

## 2020-09-07 NOTE — Progress Notes (Signed)
The patient comes in today 2 weeks tomorrow status post a right total hip arthroplasty.  He is ambulating with a cane.  He is doing well overall.  He has been on a baby aspirin twice a day.  He is already off narcotics.  He was on a baby aspirin once a day prior to surgery.  I did let him know that he should go back to just once a day baby aspirin.  He does report some knee pain and overall has been very satisfied with the surgery.  On examination his calf is soft on the right side his right incision looks good.  I remove the staples in place Steri-Strips.  His leg lengths feel equal.  He will continue to increase his activities as comfort allows.  I will see him back in 4 weeks for repeat exam but no x-rays are needed.  All questions and concerns were answered and addressed.

## 2020-09-08 ENCOUNTER — Telehealth: Payer: Self-pay | Admitting: *Deleted

## 2020-09-08 ENCOUNTER — Other Ambulatory Visit: Payer: Medicare PPO

## 2020-09-08 DIAGNOSIS — Z20822 Contact with and (suspected) exposure to covid-19: Secondary | ICD-10-CM | POA: Diagnosis not present

## 2020-09-08 NOTE — Telephone Encounter (Signed)
14 day Ortho bundle call completed.  

## 2020-09-09 LAB — SARS-COV-2, NAA 2 DAY TAT

## 2020-09-09 LAB — NOVEL CORONAVIRUS, NAA: SARS-CoV-2, NAA: NOT DETECTED

## 2020-10-05 ENCOUNTER — Encounter: Payer: Self-pay | Admitting: Orthopaedic Surgery

## 2020-10-05 ENCOUNTER — Ambulatory Visit (INDEPENDENT_AMBULATORY_CARE_PROVIDER_SITE_OTHER): Payer: Medicare PPO | Admitting: Orthopaedic Surgery

## 2020-10-05 ENCOUNTER — Telehealth: Payer: Self-pay | Admitting: *Deleted

## 2020-10-05 DIAGNOSIS — Z96641 Presence of right artificial hip joint: Secondary | ICD-10-CM

## 2020-10-05 NOTE — Telephone Encounter (Signed)
Ortho bundle 30 day in office visit completed. 

## 2020-10-05 NOTE — Progress Notes (Signed)
The patient is 6 weeks status post a right total hip arthroplasty.  He said he is doing well.  He is playing golf.  He appreciates his leg lengths are on.  He has some numbness around his incision but overall he feels like he has got good motion and strength.  He is walking without an assistive device either.  On exam his right operative hip moves smoothly and fluidly.  His leg lengths are equal.  At this point we do not need to see him back for 6 months unless he is having issues.  At that visit I like a standing AP pelvis and a lateral of his right hip.

## 2020-11-24 ENCOUNTER — Telehealth: Payer: Self-pay | Admitting: *Deleted

## 2020-11-24 NOTE — Telephone Encounter (Signed)
Ortho bundle 90 day call attempted. Left VM requesting call back to check status and do [employer] Survey.

## 2020-11-24 NOTE — Telephone Encounter (Signed)
Patient returned call and completed 90 day Ortho bundle call and survey.

## 2021-01-02 ENCOUNTER — Other Ambulatory Visit: Payer: Self-pay | Admitting: Family Medicine

## 2021-01-02 DIAGNOSIS — E7800 Pure hypercholesterolemia, unspecified: Secondary | ICD-10-CM

## 2021-01-04 DIAGNOSIS — H2513 Age-related nuclear cataract, bilateral: Secondary | ICD-10-CM | POA: Diagnosis not present

## 2021-02-06 ENCOUNTER — Other Ambulatory Visit: Payer: Self-pay

## 2021-02-06 ENCOUNTER — Ambulatory Visit: Payer: Medicare PPO | Attending: Internal Medicine

## 2021-02-06 DIAGNOSIS — Z23 Encounter for immunization: Secondary | ICD-10-CM

## 2021-02-06 MED ORDER — COVID-19 MRNA VAC-TRIS(PFIZER) 30 MCG/0.3ML IM SUSP
INTRAMUSCULAR | 0 refills | Status: AC
  Filled 2021-02-06: qty 0.3, 1d supply, fill #0

## 2021-02-06 NOTE — Progress Notes (Signed)
   Covid-19 Vaccination Clinic  Name:  Ikechukwu Cerny    MRN: [id number] DOB: 16-Jun-1951  02/06/2021  Mr. Dinovo was observed post Covid-19 immunization for 15 minutes without incident. He was provided with Vaccine Information Sheet and instruction to access the V-Safe system.   Mr. Ysaguirre was instructed to call 911 with any severe reactions post vaccine: Difficulty breathing  Swelling of face and throat  A fast heartbeat  A bad rash all over body  Dizziness and weakness   Immunizations Administered     Name Date Dose VIS Date Route   PFIZER Comrnaty(Gray TOP) Covid-19 Vaccine 02/06/2021  1:15 PM 0.3 mL 07/06/2020 Intramuscular   Manufacturer: [employer], [employer]   Lot: [id number]   NDC: [id number]

## 2021-02-13 ENCOUNTER — Ambulatory Visit: Payer: Self-pay

## 2021-02-13 ENCOUNTER — Ambulatory Visit: Payer: Medicare PPO | Admitting: Orthopaedic Surgery

## 2021-02-13 ENCOUNTER — Encounter: Payer: Self-pay | Admitting: Orthopaedic Surgery

## 2021-02-13 ENCOUNTER — Other Ambulatory Visit: Payer: Self-pay

## 2021-02-13 DIAGNOSIS — M25551 Pain in right hip: Secondary | ICD-10-CM | POA: Diagnosis not present

## 2021-02-13 DIAGNOSIS — Z96641 Presence of right artificial hip joint: Secondary | ICD-10-CM | POA: Diagnosis not present

## 2021-02-13 MED ORDER — METHYLPREDNISOLONE 4 MG PO TABS: ORAL_TABLET | ORAL | 0 refills | Status: DC

## 2021-02-13 NOTE — Progress Notes (Signed)
The patient is very well-known to me.  We replaced his right hip in January of this year.  That is done very well.  He is an avid golfer as well.  About 5 weeks ago he was lifting things around the house and he developed some pain.  The pain is above his hip and around his waist above the iliac crest area and it runs around to his back and seems to be more of an oblique muscle strain.  He denies any radicular symptoms.  He denies any groin pain or issues with his right total hip.  He is an avid Teacher, English as a foreign language.  He says for over-the-counter Advil have helped decrease his symptoms but is still present.  He is not diabetic either.  He denies any weakness or any issues with his right total hip arthroplasty.  He is walking with a normal gait.  His right hip exam is normal.  He does have pain along the course of the oblique muscles on his right side above the iliac crest and into the back area.  An x-ray of the pelvis and hip show normal seated right total hip arthroplasty.  I do believe this is more of a musculoskeletal strain and I will start him on a steroid taper as well as have him continue his Robaxin that he has at home and occasional Advil.  I did give him prescription to consider outpatient physical therapy if this is not coming down after another week or 2.  From my standpoint, I do not need to see him back for 6 months unless he is having issues.  We will have a final AP pelvis at that visit.  All questions and concerns were answered and addressed.

## 2021-02-14 ENCOUNTER — Ambulatory Visit (INDEPENDENT_AMBULATORY_CARE_PROVIDER_SITE_OTHER): Payer: Medicare PPO | Admitting: Family Medicine

## 2021-02-14 ENCOUNTER — Encounter: Payer: Self-pay | Admitting: Family Medicine

## 2021-02-14 VITALS — BP 131/83 | HR 59 | Temp 98.3°F | Resp 16 | Ht 72.0 in | Wt 212.0 lb

## 2021-02-14 DIAGNOSIS — Z125 Encounter for screening for malignant neoplasm of prostate: Secondary | ICD-10-CM | POA: Diagnosis not present

## 2021-02-14 DIAGNOSIS — N400 Enlarged prostate without lower urinary tract symptoms: Secondary | ICD-10-CM

## 2021-02-14 DIAGNOSIS — N410 Acute prostatitis: Secondary | ICD-10-CM

## 2021-02-14 DIAGNOSIS — R7303 Prediabetes: Secondary | ICD-10-CM | POA: Diagnosis not present

## 2021-02-14 DIAGNOSIS — E7800 Pure hypercholesterolemia, unspecified: Secondary | ICD-10-CM

## 2021-02-14 DIAGNOSIS — Z1211 Encounter for screening for malignant neoplasm of colon: Secondary | ICD-10-CM

## 2021-02-14 DIAGNOSIS — I10 Essential (primary) hypertension: Secondary | ICD-10-CM | POA: Diagnosis not present

## 2021-02-14 DIAGNOSIS — Z0000 Encounter for general adult medical examination without abnormal findings: Secondary | ICD-10-CM

## 2021-02-14 LAB — POCT URINALYSIS DIPSTICK
Bilirubin, UA: NEGATIVE
Glucose, UA: NEGATIVE
Ketones, UA: NEGATIVE
Leukocytes, UA: NEGATIVE
Microscopic Examination: NEGATIVE
Nitrite, UA: NEGATIVE
Protein, UA: NEGATIVE
Spec Grav, UA: 1.02 (ref 1.010–1.025)
Urobilinogen, UA: 0.2 E.U./dL
pH, UA: 6 (ref 5.0–8.0)

## 2021-02-14 LAB — IFOBT (OCCULT BLOOD): IFOBT: NEGATIVE

## 2021-02-14 NOTE — Progress Notes (Signed)
I,[name],acting as a scribe for Megan Mans, MD.,have documented all relevant documentation on the behalf of [name], MD,as directed by  Megan Mans, MD while in the presence of Megan Mans, MD.   Annual Wellness Visit     Patient: Gabriel Mccormick, Male    DOB: 29-Sep-1950, 70 y.o.   MRN: [id number] Visit Date: 02/14/2021  Today's Provider: Megan Mans, MD   Chief Complaint  Patient presents with   Medicare Wellness   Subjective    Gabriel Mccormick is a 70 y.o. male who presents today for his Annual Wellness Visit. He reports consuming a general diet. Home exercise routine includes walking and golf. He generally feels well. He reports sleeping fairly well. He does not have additional problems to discuss today.   HPI Patient did have recent symptoms of prostatitis which seems to have resolved.     Medications: Outpatient Medications Prior to Visit  Medication Sig   amLODipine (NORVASC) 5 MG tablet TAKE ONE TABLET BY MOUTH EVERY DAY   aspirin (ASPIRIN 81) 81 MG chewable tablet Chew 1 tablet (81 mg total) by mouth in the morning and at bedtime.   COVID-19 mRNA Vac-TriS, Pfizer, SUSP injection Inject into the muscle.   diflunisal (DOLOBID) 500 MG TABS tablet Take 1 tablet (500 mg total) by mouth 2 (two) times daily as needed. (Patient taking differently: Take 500 mg by mouth 2 (two) times daily as needed for moderate pain.)   ibuprofen (ADVIL,MOTRIN) 200 MG tablet Take 400 mg by mouth every 6 (six) hours as needed for moderate pain.   methocarbamol (ROBAXIN) 500 MG tablet Take 1 tablet (500 mg total) by mouth every 6 (six) hours as needed.   methylPREDNISolone (MEDROL) 4 MG tablet Medrol dose pack. Take as instructed   MULTIPLE VITAMIN PO Take 1 tablet by mouth daily.   Omega-3 Fatty Acids (FISH OIL) 1000 MG CAPS Take 1,000 mg by mouth daily.   ondansetron (ZOFRAN ODT) 4 MG disintegrating tablet Take 1 tablet (4 mg  total) by mouth every 8 (eight) hours as needed for nausea or vomiting.   oxyCODONE (ROXICODONE) 5 MG immediate release tablet Take 1-2 tablets (5-10 mg total) by mouth every 4 (four) hours as needed for severe pain.   rosuvastatin (CRESTOR) 10 MG tablet TAKE ONE TABLET EVERY DAY   No facility-administered medications prior to visit.    No Known Allergies  Patient Care Team: Maple Hudson., MD as PCP - General (Family Medicine) Kathryne Hitch, MD as Consulting Physician (Orthopedic Surgery) Dasher, Cliffton Asters, MD (Dermatology) Galen Manila, MD as Referring Physician (Ophthalmology) Stanton Kidney, MD as Consulting Physician (Gastroenterology)  Review of Systems  HENT:  Positive for tinnitus.   All other systems reviewed and are negative.      Objective    Vitals: BP 131/83 (BP Location: Left Arm, Patient Position: Sitting, Cuff Size: Large)   Pulse (!) 59   Temp 98.3 F (36.8 C) (Oral)   Resp 16   Ht 6' (1.829 m)   Wt 212 lb (96.2 kg)   SpO2 97%   BMI 28.75 kg/m    Physical Exam Vitals reviewed.  Constitutional:      Appearance: Normal appearance.  HENT:     Head: Normocephalic and atraumatic.     Right Ear: Tympanic membrane, ear canal and external ear normal.     Left Ear: Tympanic membrane, ear canal and external ear normal.  Nose: Nose normal.     Mouth/Throat:     Mouth: Mucous membranes are moist.     Pharynx: Oropharynx is clear.  Eyes:     Extraocular Movements: Extraocular movements intact.     Conjunctiva/sclera: Conjunctivae normal.     Pupils: Pupils are equal, round, and reactive to light.  Neck:     Vascular: No carotid bruit.  Cardiovascular:     Rate and Rhythm: Normal rate and regular rhythm.     Pulses: Normal pulses.     Heart sounds: Normal heart sounds.  Pulmonary:     Effort: Pulmonary effort is normal.     Breath sounds: Normal breath sounds.  Abdominal:     General: Bowel sounds are normal.     Palpations:  Abdomen is soft.     Tenderness: There is no abdominal tenderness.  Musculoskeletal:        General: No tenderness.     Cervical back: Normal range of motion. No tenderness.     Right lower leg: No edema.     Left lower leg: No edema.  Lymphadenopathy:     Cervical: No cervical adenopathy.  Skin:    General: Skin is warm and dry.  Neurological:     Mental Status: He is alert and oriented to person, place, and time. Mental status is at baseline.  Psychiatric:        Mood and Affect: Mood normal.        Behavior: Behavior normal.        Thought Content: Thought content normal.        Judgment: Judgment normal.     Most recent functional status assessment: In your present state of health, do you have any difficulty performing the following activities: 02/14/2021  Hearing? -  Vision? N  Difficulty concentrating or making decisions? N  Walking or climbing stairs? N  Dressing or bathing? N  Doing errands, shopping? N  Some recent data might be hidden   Most recent fall risk assessment: Fall Risk  02/14/2021  Falls in the past year? 0  Number falls in past yr: 0  Injury with Fall? 0  Risk for fall due to : No Fall Risks  Follow up Falls evaluation completed    Most recent depression screenings: PHQ 2/9 Scores 02/14/2021 02/02/2020  PHQ - 2 Score 0 0  PHQ- 9 Score 0 -   Most recent cognitive screening: 6CIT Screen 01/27/2019  What Year? 0 points  What month? 0 points  What time? 0 points  Count back from 20 0 points  Months in reverse 0 points  Repeat phrase 0 points  Total Score 0   Most recent Audit-C alcohol use screening Alcohol Use Disorder Test (AUDIT) 02/14/2021  1. How often do you have a drink containing alcohol? 1  2. How many drinks containing alcohol do you have on a typical day when you are drinking? 0  3. How often do you have six or more drinks on one occasion? 0  AUDIT-C Score 1  Alcohol Brief Interventions/Follow-up -   A score of 3 or more in women, and  4 or more in men indicates increased risk for alcohol abuse, EXCEPT if all of the points are from question 1   Results for orders placed or performed in visit on 02/14/21  POCT urinalysis dipstick  Result Value Ref Range   Color, UA Yellow    Clarity, UA Slightly Cloudy    Glucose, UA Negative Negative  Bilirubin, UA Negative    Ketones, UA Negative    Spec Grav, UA 1.020 1.010 - 1.025   Blood, UA Trace Hemolyzed    pH, UA 6.0 5.0 - 8.0   Protein, UA Negative Negative   Urobilinogen, UA 0.2 0.2 or 1.0 E.U./dL   Nitrite, UA Negative    Leukocytes, UA Negative Negative   Microscopic Examination Negative   IFOBT POC (occult bld, rslt in office)  Result Value Ref Range   IFOBT Negative     Assessment & Plan     Annual wellness visit done today including the all of the following: Reviewed patient's Family Medical History Reviewed and updated list of patient's medical providers Assessment of cognitive impairment was done Assessed patient's functional ability Established a written schedule for health screening services Health Risk Assessent Completed and Reviewed  Exercise Activities and Dietary recommendations  Goals      DIET - INCREASE WATER INTAKE     Recommend increasing water intake to 4 glasses a day.          Immunization History  Administered Date(s) Administered   Fluad Quad(high Dose 65+) 05/05/2019   Influenza, High Dose Seasonal PF 06/11/2018   Influenza-Unspecified 05/15/2020   PFIZER Comirnaty(Gray Top)Covid-19 Tri-Sucrose Vaccine 02/06/2021   PFIZER(Purple Top)SARS-COV-2 Vaccination 09/08/2019, 09/29/2019, 05/01/2020   Pneumococcal Conjugate-13 06/11/2018   Pneumococcal Polysaccharide-23 06/28/2019   Tdap 12/01/2008   Zoster, Live 12/12/2011    Health Maintenance  Topic Date Due   Zoster Vaccines- Shingrix (1 of 2) Never done   TETANUS/TDAP  12/02/2018   INFLUENZA VACCINE  02/26/2021   COLONOSCOPY (Pts 45-8yrs Insurance coverage will need to be  confirmed)  10/18/2024   COVID-19 Vaccine  Completed   Hepatitis C Screening  Completed   PNA vac Low Risk Adult  Completed   HPV VACCINES  Aged Out     Discussed health benefits of physical activity, and encouraged him to engage in regular exercise appropriate for his age and condition.   1. Encounter for Medicare annual wellness exam  - Lipid panel - TSH - CBC w/Diff/Platelet - Comprehensive Metabolic Panel (CMET) - Hemoglobin A1c  2. Annual physical exam   3. Essential hypertension  - Lipid panel - TSH - CBC w/Diff/Platelet - Comprehensive Metabolic Panel (CMET) - Hemoglobin A1c  4. Pure hypercholesterolemia  - Lipid panel - TSH - CBC w/Diff/Platelet - Comprehensive Metabolic Panel (CMET) - Hemoglobin A1c  5. Benign prostatic hyperplasia without lower urinary tract symptoms  - PSA  6. Screening for prostate cancer  - PSA  7. Acute prostatitis This seems to completely resolve. - POCT urinalysis dipstick  8. Encounter for screening fecal occult blood testing  - IFOBT POC (occult bld, rslt in office); Future - IFOBT POC (occult bld, rslt in office)  9. Prediabetes  - Lipid panel - TSH - CBC w/Diff/Platelet - Comprehensive Metabolic Panel (CMET) - Hemoglobin A1c   Return in about 1 year (around 02/14/2022).     I, Megan Mans, MD, have reviewed all documentation for this visit. The documentation on 02/19/21 for the exam, diagnosis, procedures, and orders are all accurate and complete.    Paiton Wendelyn Breslow, MD  [facility] [phone] (phone) [phone] (fax)  [facility]

## 2021-02-15 LAB — CBC WITH DIFFERENTIAL/PLATELET
Basophils Absolute: 0.1 10*3/uL (ref 0.0–0.2)
Basos: 1 %
EOS (ABSOLUTE): 0.2 10*3/uL (ref 0.0–0.4)
Eos: 2 %
Hematocrit: 44.8 % (ref 37.5–51.0)
Hemoglobin: 15.3 g/dL (ref 13.0–17.7)
Immature Grans (Abs): 0 10*3/uL (ref 0.0–0.1)
Immature Granulocytes: 0 %
Lymphocytes Absolute: 1.7 10*3/uL (ref 0.7–3.1)
Lymphs: 22 %
MCH: 30.7 pg (ref 26.6–33.0)
MCHC: 34.2 g/dL (ref 31.5–35.7)
MCV: 90 fL (ref 79–97)
Monocytes Absolute: 0.6 10*3/uL (ref 0.1–0.9)
Monocytes: 8 %
Neutrophils Absolute: 5 10*3/uL (ref 1.4–7.0)
Neutrophils: 67 %
Platelets: 242 10*3/uL (ref 150–450)
RBC: 4.98 x10E6/uL (ref 4.14–5.80)
RDW: 12.5 % (ref 11.6–15.4)
WBC: 7.6 10*3/uL (ref 3.4–10.8)

## 2021-02-15 LAB — PSA: Prostate Specific Ag, Serum: 2.5 ng/mL (ref 0.0–4.0)

## 2021-02-15 LAB — COMPREHENSIVE METABOLIC PANEL
ALT: 14 IU/L (ref 0–44)
AST: 16 IU/L (ref 0–40)
Albumin/Globulin Ratio: 1.7 (ref 1.2–2.2)
Albumin: 4.7 g/dL (ref 3.8–4.8)
Alkaline Phosphatase: 57 IU/L (ref 44–121)
BUN/Creatinine Ratio: 20 (ref 10–24)
BUN: 19 mg/dL (ref 8–27)
Bilirubin Total: 0.7 mg/dL (ref 0.0–1.2)
CO2: 22 mmol/L (ref 20–29)
Calcium: 9.3 mg/dL (ref 8.6–10.2)
Chloride: 102 mmol/L (ref 96–106)
Creatinine, Ser: 0.94 mg/dL (ref 0.76–1.27)
Globulin, Total: 2.7 g/dL (ref 1.5–4.5)
Glucose: 99 mg/dL (ref 65–99)
Potassium: 4.4 mmol/L (ref 3.5–5.2)
Sodium: 140 mmol/L (ref 134–144)
Total Protein: 7.4 g/dL (ref 6.0–8.5)
eGFR: 88 mL/min/{1.73_m2} (ref >59)

## 2021-02-15 LAB — LIPID PANEL
Chol/HDL Ratio: 4.2 ratio (ref 0.0–5.0)
Cholesterol, Total: 194 mg/dL (ref 100–199)
HDL: 46 mg/dL (ref >39)
LDL Chol Calc (NIH): 122 mg/dL — ABNORMAL HIGH (ref 0–99)
Triglycerides: 144 mg/dL (ref 0–149)
VLDL Cholesterol Cal: 26 mg/dL (ref 5–40)

## 2021-02-15 LAB — HEMOGLOBIN A1C
Est. average glucose Bld gHb Est-mCnc: 128 mg/dL
Hgb A1c MFr Bld: 6.1 % — ABNORMAL HIGH (ref 4.8–5.6)

## 2021-02-15 LAB — TSH: TSH: 2.3 u[IU]/mL (ref 0.450–4.500)

## 2021-02-27 ENCOUNTER — Other Ambulatory Visit: Payer: Self-pay | Admitting: Family Medicine

## 2021-02-27 DIAGNOSIS — I10 Essential (primary) hypertension: Secondary | ICD-10-CM

## 2021-03-05 ENCOUNTER — Telehealth: Payer: Self-pay

## 2021-03-05 ENCOUNTER — Other Ambulatory Visit: Payer: Self-pay | Admitting: Orthopaedic Surgery

## 2021-03-05 MED ORDER — METHOCARBAMOL 500 MG PO TABS: 500.0000 mg | ORAL_TABLET | Freq: Four times a day (QID) | ORAL | 1 refills | Status: DC | PRN

## 2021-03-05 NOTE — Telephone Encounter (Signed)
Request refill of Robaxin [facility]

## 2021-04-03 ENCOUNTER — Other Ambulatory Visit: Payer: Self-pay | Admitting: Family Medicine

## 2021-04-03 DIAGNOSIS — E7800 Pure hypercholesterolemia, unspecified: Secondary | ICD-10-CM

## 2021-04-09 ENCOUNTER — Ambulatory Visit: Payer: Medicare PPO | Admitting: Orthopaedic Surgery

## 2021-04-12 DIAGNOSIS — X32XXXA Exposure to sunlight, initial encounter: Secondary | ICD-10-CM | POA: Diagnosis not present

## 2021-04-12 DIAGNOSIS — D2262 Melanocytic nevi of left upper limb, including shoulder: Secondary | ICD-10-CM | POA: Diagnosis not present

## 2021-04-12 DIAGNOSIS — D2271 Melanocytic nevi of right lower limb, including hip: Secondary | ICD-10-CM | POA: Diagnosis not present

## 2021-04-12 DIAGNOSIS — L821 Other seborrheic keratosis: Secondary | ICD-10-CM | POA: Diagnosis not present

## 2021-04-12 DIAGNOSIS — D2272 Melanocytic nevi of left lower limb, including hip: Secondary | ICD-10-CM | POA: Diagnosis not present

## 2021-04-12 DIAGNOSIS — L570 Actinic keratosis: Secondary | ICD-10-CM | POA: Diagnosis not present

## 2021-04-12 DIAGNOSIS — D225 Melanocytic nevi of trunk: Secondary | ICD-10-CM | POA: Diagnosis not present

## 2021-04-12 DIAGNOSIS — D2261 Melanocytic nevi of right upper limb, including shoulder: Secondary | ICD-10-CM | POA: Diagnosis not present

## 2021-05-01 DIAGNOSIS — K429 Umbilical hernia without obstruction or gangrene: Secondary | ICD-10-CM | POA: Diagnosis not present

## 2021-07-27 ENCOUNTER — Ambulatory Visit: Payer: Self-pay

## 2021-07-27 NOTE — Telephone Encounter (Signed)
°  Chief Complaint: Productive cough Symptoms: Cough, runny nose, green mucus Frequency: Started 3 weeks ago Pertinent Negatives: Patient denies fever Disposition: [] ED /[] Urgent Care (no appt availability in office) / [x] Appointment(In office/virtual)/ []  [place] [facility]/ [] Home Care/ [] Refused Recommended Disposition /[] [place] [facility]/ []  Follow-up with PCP Additional Notes: Recommended My Chart visit. Insisted on in office appointment. Appointment made. If pt. Does e-visit, will cancel appointment.    Reason for Disposition  Cough has been present for > 3 weeks  Answer Assessment - Initial Assessment Questions 1. ONSET: "When did the cough begin?"      3 weeks ago 2. SEVERITY: "How bad is the cough today?"      Moderate 3. SPUTUM: "Describe the color of your sputum" (none, dry cough; clear, white, yellow, green)     Green 4. HEMOPTYSIS: "Are you coughing up any blood?" If so ask: "How much?" (flecks, streaks, tablespoons, etc.)     No 5. DIFFICULTY BREATHING: "Are you having difficulty breathing?" If Yes, ask: "How bad is it?" (e.g., mild, moderate, severe)    - MILD: No SOB at rest, mild SOB with walking, speaks normally in sentences, can lie down, no retractions, pulse < 100.    - MODERATE: SOB at rest, SOB with minimal exertion and prefers to sit, cannot lie down flat, speaks in phrases, mild retractions, audible wheezing, pulse 100-120.    - SEVERE: Very SOB at rest, speaks in single words, struggling to breathe, sitting hunched forward, retractions, pulse > 120      No 6. FEVER: "Do you have a fever?" If Yes, ask: "What is your temperature, how was it measured, and when did it start?"     No 7. CARDIAC HISTORY: "Do you have any history of heart disease?" (e.g., heart attack, congestive heart failure)      No 8. LUNG HISTORY: "Do you have any history of lung disease?"  (e.g., pulmonary embolus, asthma, emphysema)     No 9. PE RISK FACTORS: "Do you have a history  of blood clots?" (or: recent major surgery, recent prolonged travel, bedridden)     No 10. OTHER SYMPTOMS: "Do you have any other symptoms?" (e.g., runny nose, wheezing, chest pain)       Runny nose, sinus symptoms 11. PREGNANCY: "Is there any chance you are pregnant?" "When was your last menstrual period?"       N/a 12. TRAVEL: "Have you traveled out of the country in the last month?" (e.g., travel history, exposures)       No  Protocols used: Cough - Acute Productive-A-AH

## 2021-07-27 NOTE — Telephone Encounter (Signed)
Pt. Triaged.

## 2021-07-31 ENCOUNTER — Encounter: Payer: Self-pay | Admitting: Physician Assistant

## 2021-07-31 ENCOUNTER — Ambulatory Visit: Payer: Medicare PPO | Admitting: Physician Assistant

## 2021-07-31 ENCOUNTER — Other Ambulatory Visit: Payer: Self-pay

## 2021-07-31 VITALS — BP 128/79 | HR 69 | Temp 98.5°F | Ht 71.0 in | Wt 207.5 lb

## 2021-07-31 DIAGNOSIS — J0110 Acute frontal sinusitis, unspecified: Secondary | ICD-10-CM | POA: Diagnosis not present

## 2021-07-31 MED ORDER — AZELASTINE HCL 0.1 % NA SOLN: 2.0000 | Freq: Two times a day (BID) | NASAL | 2 refills | Status: DC

## 2021-07-31 MED ORDER — AMOXICILLIN-POT CLAVULANATE 875-125 MG PO TABS: 1.0000 | ORAL_TABLET | Freq: Two times a day (BID) | ORAL | 0 refills | Status: AC

## 2021-07-31 NOTE — Progress Notes (Signed)
Established patient visit   Patient: Gabriel Mccormick   DOB: [birth year]   71 y.o. Male  MRN: [id number] Visit Date: 07/31/2021  Today's healthcare provider: Mikey Kirschner, PA-C   Cc. Cough x 4 weeks  Subjective    HPI  Gabriel Mccormick is a 71 y/o male who presents today with nasal congestion, sinus pressure, headaches, eye redness, postnasal drip and cough for the last 4 weeks.  He reports his symptoms are not very severe, managed with Alka-Seltzer cold and sinus until the last week or so where his symptoms have worsened.  He reports his symptoms are the worst in the morning feels very clogged.  Denies current fevers, chills, body aches.  Denies any shortness of breath    Medications: Outpatient Medications Prior to Visit  Medication Sig   amLODipine (NORVASC) 5 MG tablet TAKE ONE TABLET BY MOUTH EVERY DAY   aspirin (ASPIRIN 81) 81 MG chewable tablet Chew 1 tablet (81 mg total) by mouth in the morning and at bedtime.   COVID-19 mRNA Vac-TriS, Pfizer, SUSP injection Inject into the muscle.   diflunisal (DOLOBID) 500 MG TABS tablet Take 1 tablet (500 mg total) by mouth 2 (two) times daily as needed. (Patient taking differently: Take 500 mg by mouth 2 (two) times daily as needed for moderate pain.)   ibuprofen (ADVIL,MOTRIN) 200 MG tablet Take 400 mg by mouth every 6 (six) hours as needed for moderate pain.   methocarbamol (ROBAXIN) 500 MG tablet Take 1 tablet (500 mg total) by mouth every 6 (six) hours as needed.   methylPREDNISolone (MEDROL) 4 MG tablet Medrol dose pack. Take as instructed   MULTIPLE VITAMIN PO Take 1 tablet by mouth daily.   Omega-3 Fatty Acids (FISH OIL) 1000 MG CAPS Take 1,000 mg by mouth daily.   ondansetron (ZOFRAN ODT) 4 MG disintegrating tablet Take 1 tablet (4 mg total) by mouth every 8 (eight) hours as needed for nausea or vomiting.   oxyCODONE (ROXICODONE) 5 MG immediate release tablet Take 1-2 tablets (5-10 mg total) by mouth every 4 (four) hours as  needed for severe pain.   rosuvastatin (CRESTOR) 10 MG tablet TAKE ONE TABLET EVERY DAY   No facility-administered medications prior to visit.    Review of Systems  Constitutional:  Positive for fatigue. Negative for fever.  HENT:  Positive for congestion, postnasal drip, rhinorrhea, sinus pressure and sinus pain.   Respiratory:  Positive for cough. Negative for shortness of breath.   Cardiovascular:  Negative for chest pain.  Neurological:  Positive for headaches.     Objective    Blood pressure 128/79, pulse 69, temperature 98.5 F (36.9 C), temperature source Oral, height 5\' 11"  (1.803 m), weight 207 lb 8 oz (94.1 kg), SpO2 95 %.   Physical Exam Constitutional:      General: He is awake.     Appearance: He is well-developed.  HENT:     Head: Normocephalic.     Nose: Congestion present.     Mouth/Throat:     Mouth: Mucous membranes are dry.     Pharynx: Posterior oropharyngeal erythema present. No oropharyngeal exudate.  Eyes:     Conjunctiva/sclera: Conjunctivae normal.  Cardiovascular:     Rate and Rhythm: Normal rate and regular rhythm.     Heart sounds: Normal heart sounds.  Pulmonary:     Effort: Pulmonary effort is normal.     Breath sounds: Normal breath sounds.  Skin:    General: Skin is warm.  Neurological:     Mental Status: He is alert and oriented to person, place, and time.  Psychiatric:        Attention and Perception: Attention normal.        Mood and Affect: Mood normal.        Speech: Speech normal.        Behavior: Behavior is cooperative.     No results found for any visits on 07/31/21.  Assessment & Plan     Acute sinusitis Rx Augmentin x7 days Rx azelastine Encouraged increase fluids  Return if symptoms worsen or fail to improve and as scheduled.      I, Mikey Kirschner, PA-C have reviewed all documentation for this visit. The documentation on  07/31/2021 for the exam, diagnosis, procedures, and orders are all accurate and complete.     Mikey Kirschner, PA-C  [facility] [phone] (phone) [phone] (fax)  [place]

## 2021-08-16 ENCOUNTER — Ambulatory Visit (INDEPENDENT_AMBULATORY_CARE_PROVIDER_SITE_OTHER): Payer: Medicare PPO

## 2021-08-16 ENCOUNTER — Ambulatory Visit: Payer: Medicare PPO | Admitting: Orthopaedic Surgery

## 2021-08-16 ENCOUNTER — Other Ambulatory Visit: Payer: Self-pay

## 2021-08-16 ENCOUNTER — Encounter: Payer: Self-pay | Admitting: Orthopaedic Surgery

## 2021-08-16 DIAGNOSIS — Z96641 Presence of right artificial hip joint: Secondary | ICD-10-CM

## 2021-08-16 MED ORDER — METHOCARBAMOL 500 MG PO TABS: 500.0000 mg | ORAL_TABLET | Freq: Four times a day (QID) | ORAL | 1 refills | Status: AC | PRN

## 2021-08-16 NOTE — Progress Notes (Signed)
The patient is now a year out from a right total hip arthroplasty.  He does get occasional sciatic pain and IT band pain but overall is doing well.  At one time I did send in the steroid taper he never took it.  He says he may at some point.  He says day-to-day activities does not bother him he can stretch this out other times he does have some pain.  He denies any groin pain.  He denies any other acute changes in medical status.  His right operative hip moves smoothly and fluidly.  He has just a little bit of pain over the IT band at the mid to proximal third IT band and some sciatic pain but no pain over the trochanter itself.  An AP pelvis and lateral right hip shows a well-seated total hip arthroplasty with no complicating features.  At this point I did send in some methocarbamol for him per his request.  All questions and concerns were answered addressed.  If things worsen anyway he knows to let us know.

## 2021-08-28 ENCOUNTER — Other Ambulatory Visit: Payer: Self-pay | Admitting: Family Medicine

## 2021-08-28 DIAGNOSIS — I10 Essential (primary) hypertension: Secondary | ICD-10-CM

## 2021-08-29 ENCOUNTER — Other Ambulatory Visit: Payer: Self-pay | Admitting: Family Medicine

## 2021-08-29 DIAGNOSIS — I10 Essential (primary) hypertension: Secondary | ICD-10-CM

## 2021-08-29 NOTE — Telephone Encounter (Signed)
Duplicate request Requested Prescriptions  Pending Prescriptions Disp Refills   amLODipine (NORVASC) 5 MG tablet [Pharmacy Med Name: AMLODIPINE BESYLATE 5 MG TAB] 90 tablet 0    Sig: TAKE ONE TABLET BY MOUTH EVERY DAY     Cardiovascular: Calcium Channel Blockers 2 Passed - 08/29/2021 12:12 PM      Passed - Last BP in normal range    BP Readings from Last 1 Encounters:  07/31/21 128/79         Passed - Last Heart Rate in normal range    Pulse Readings from Last 1 Encounters:  07/31/21 69         Passed - Valid encounter within last 6 months    Recent Outpatient Visits          4 weeks ago Acute non-recurrent frontal sinusitis   [facility] Alfredia Ferguson, PA-C   6 months ago Encounter for [employer] annual wellness exam   [facility] Maple Hudson., MD   1 year ago Annual physical exam   [facility] Maple Hudson., MD   2 years ago Pure hypercholesterolemia   [facility] Maple Hudson., MD   2 years ago Annual physical exam   [facility] Maple Hudson., MD

## 2021-09-06 IMAGING — DX DG PORTABLE PELVIS
1 series · 1 of 1 positions shown · non-contrast
Comparison: Intraoperative images August 25, 2020; pelvis and
right hip radiographs January 25, 2020

CLINICAL DATA: Status post right hip arthroplasty

EXAM:
PORTABLE PELVIS 1-2 VIEWS

[pelvis ap]
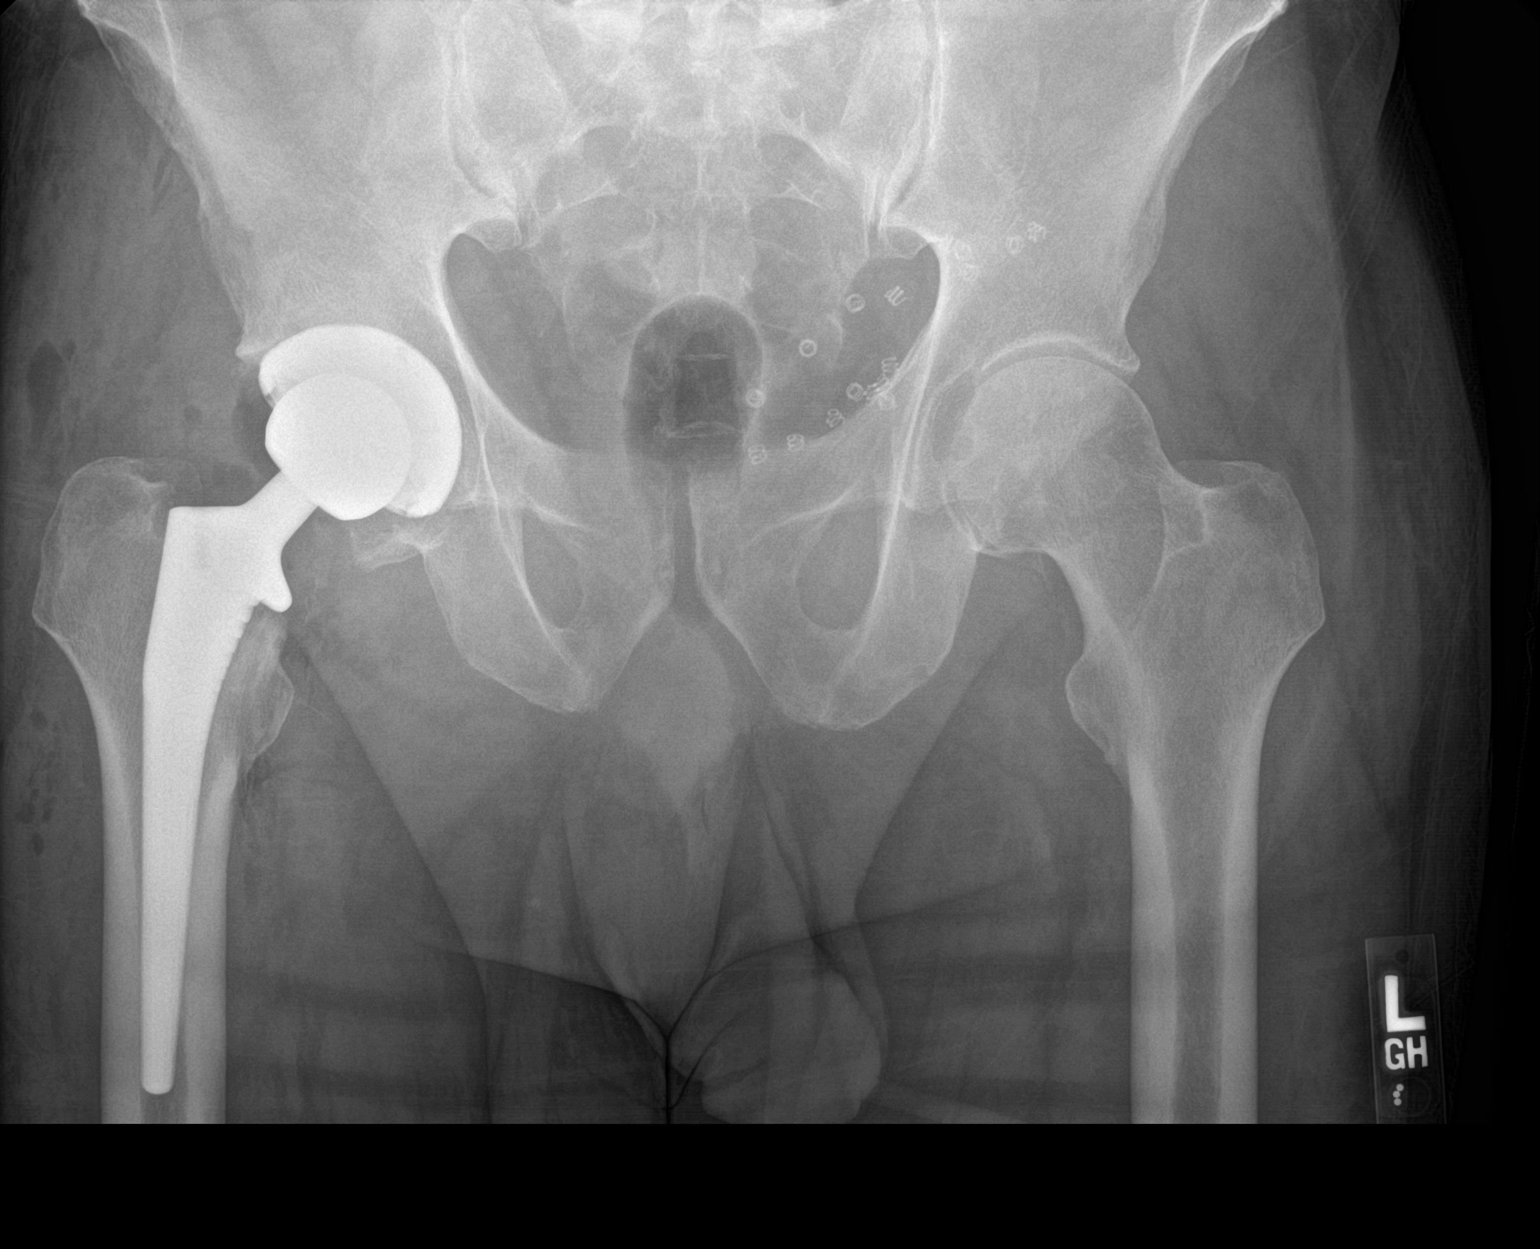

[1 of 1 positions shown; findings below may reference images not displayed]

FINDINGS: Frontal view of mid to lower pelvis and bilateral hip images
obtained. There is a total hip replacement right with prosthetic
components well-seated on frontal view. No fracture or dislocation.
There is slight narrowing of the left hip joint. Soft tissue air
noted on the right consistent with recent surgery.
IMPRESSION: Total hip replacement on the right with prosthetic components
well-seated on frontal view. No fracture or dislocation.

## 2021-09-06 IMAGING — RF DG C-ARM 1-60 MIN-NO REPORT
1 series · 3 of 3 positions shown · non-contrast
Comparison: 01/25/2020

CLINICAL DATA: Fluoroscopy provided for right hip arthroplasty.

EXAM:
OPERATIVE RIGHT HIP (WITH PELVIS IF PERFORMED) 3 VIEWS
TECHNIQUE: Fluoroscopic spot image(s) were submitted for interpretation
post-operatively.

[Series 1: unknown protocol · 0.20mm/px · 3 of 3 slices shown]
[im 1/3]
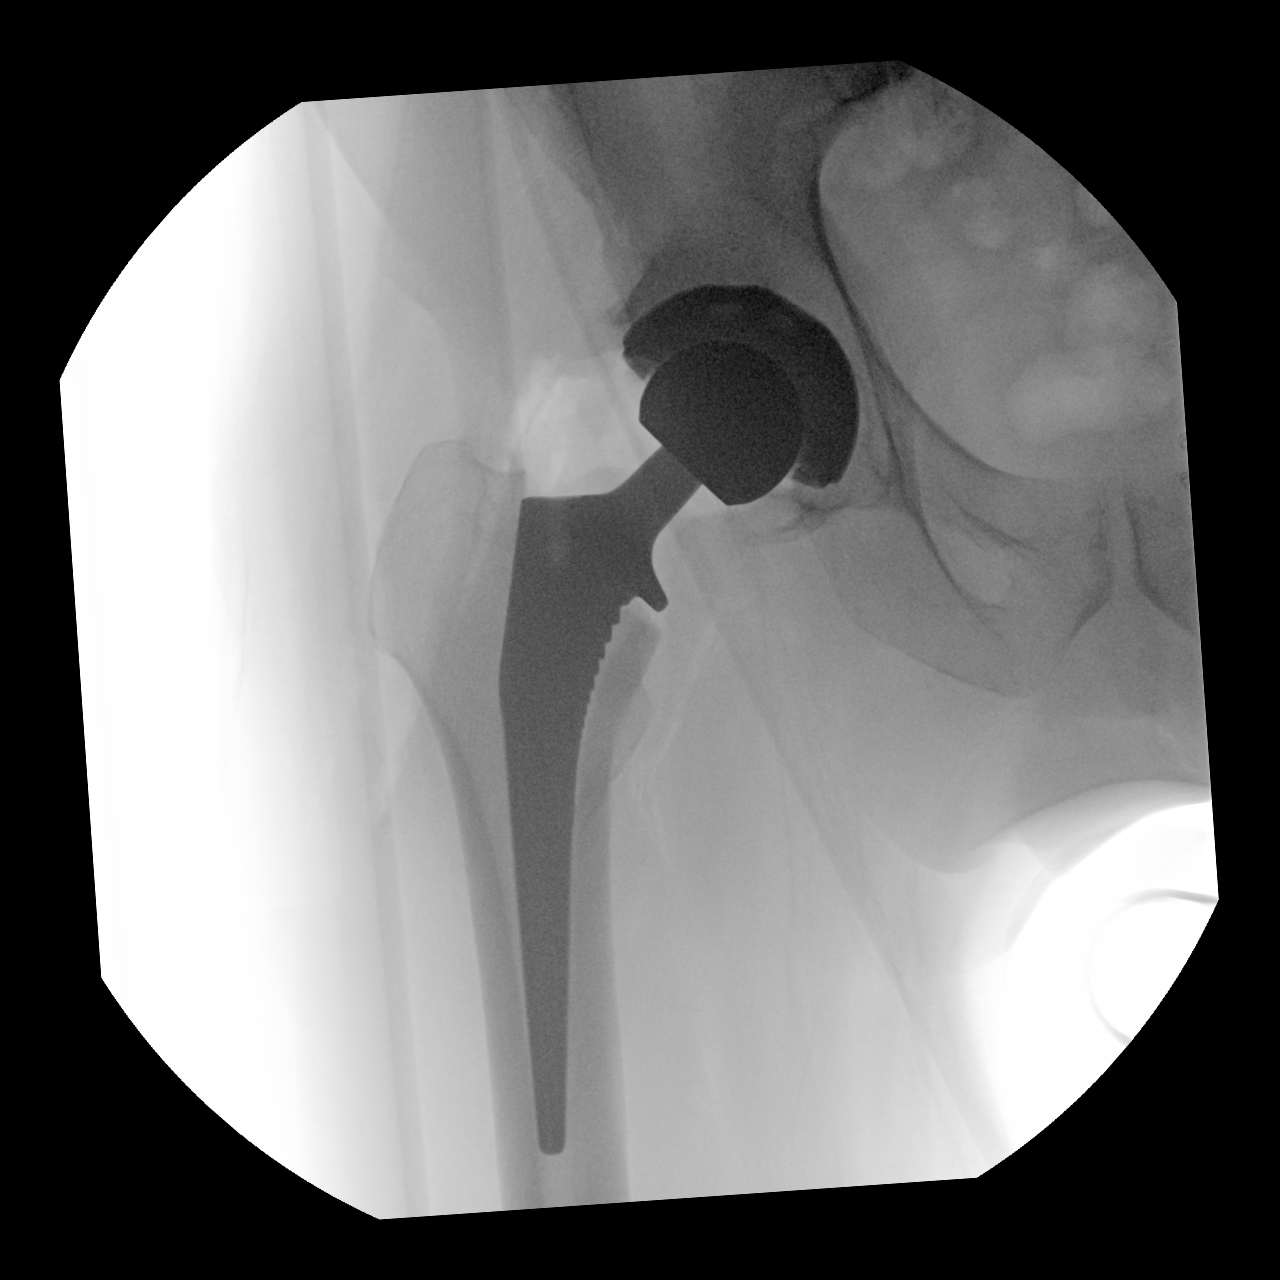
[im 2/3]
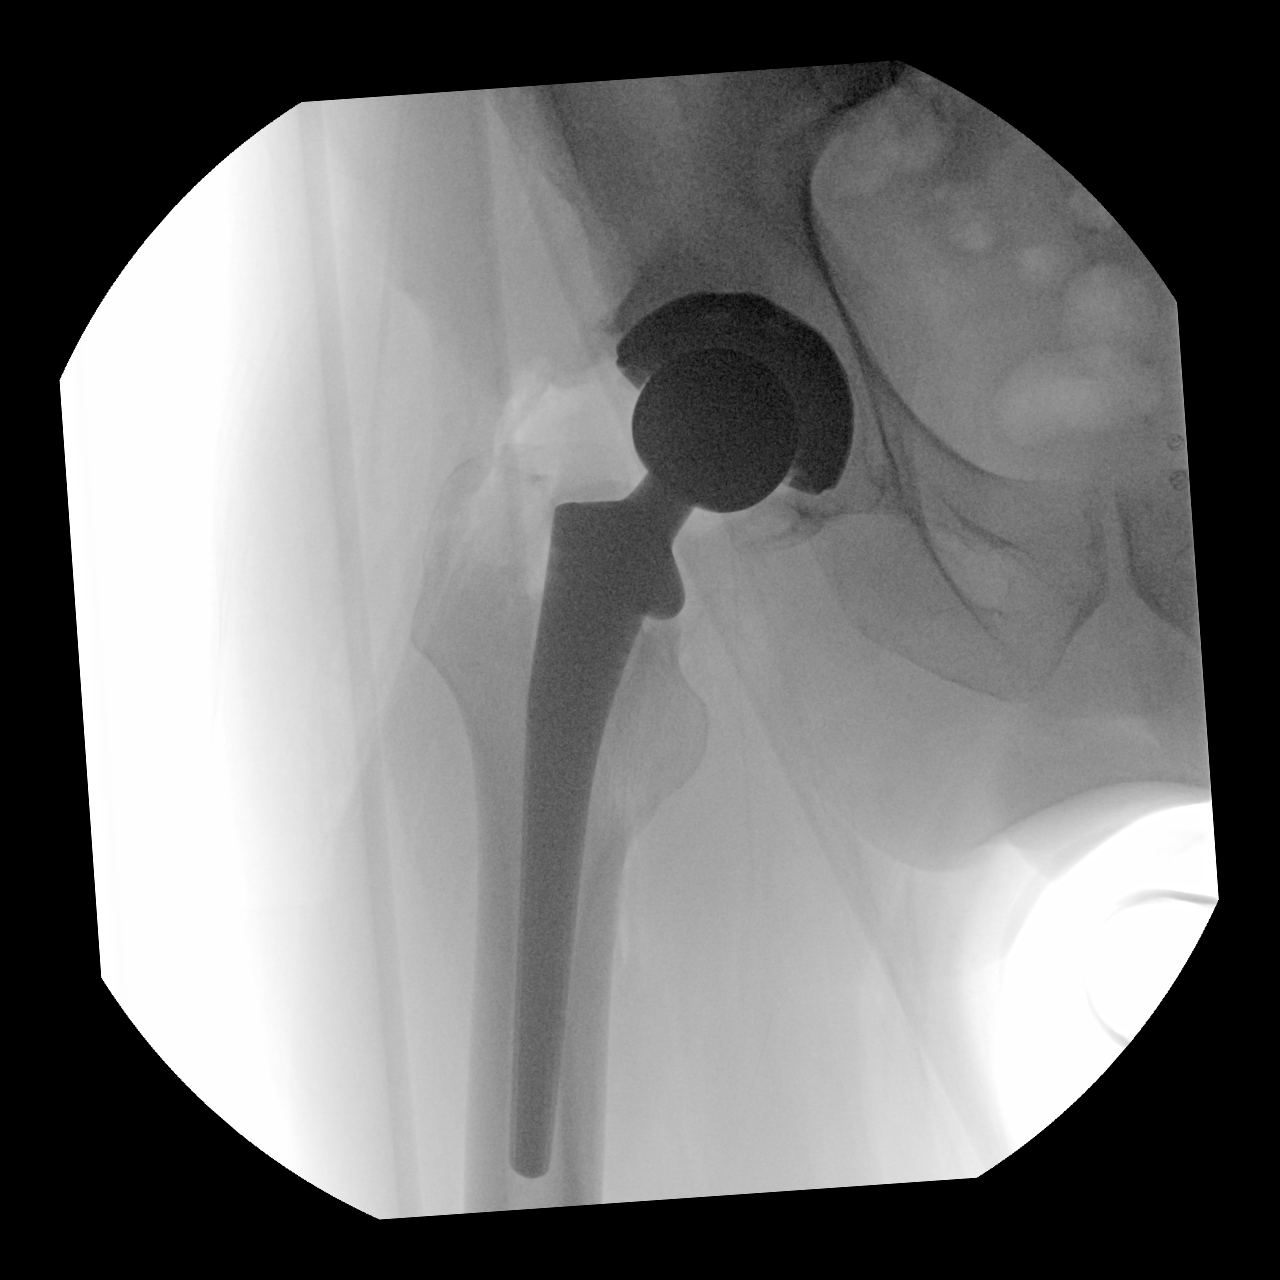
[im 3/3]
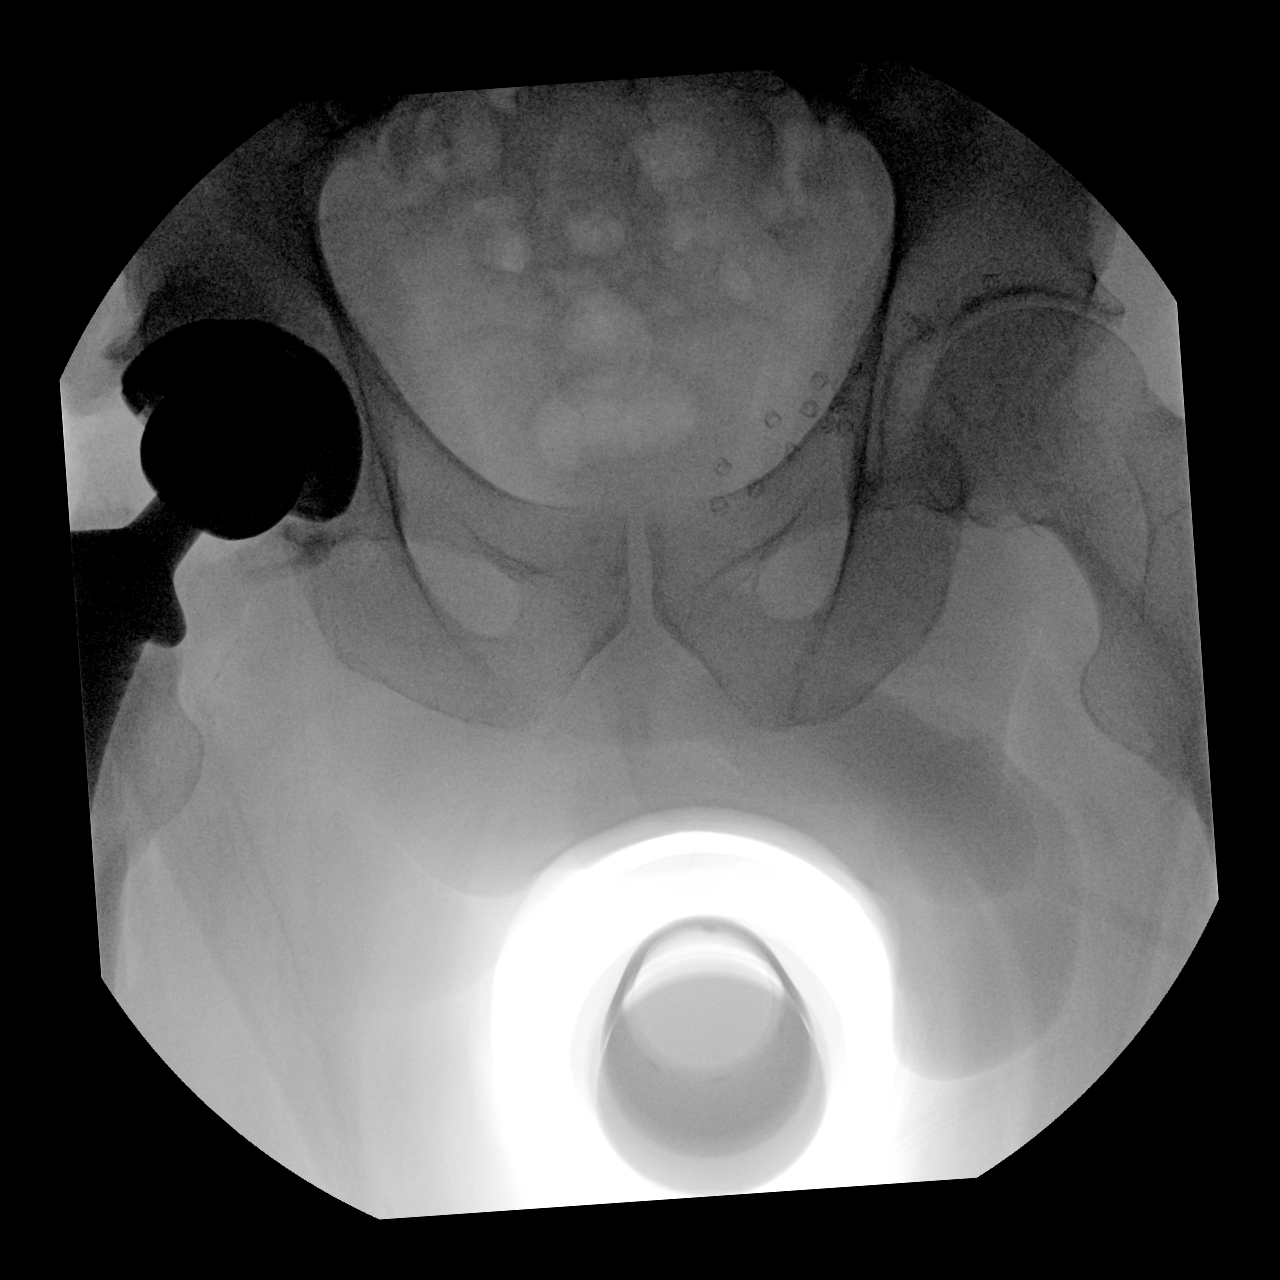

[3 of 3 positions shown; findings below may reference images not displayed]

FINDINGS: Three submitted images show a total right hip arthroplasty that
appears well seated and well aligned. There is no acute fracture or
evidence of an operative complication.
IMPRESSION: Well-positioned total right hip arthroplasty.

## 2021-11-22 ENCOUNTER — Other Ambulatory Visit: Payer: Self-pay | Admitting: Family Medicine

## 2021-11-22 DIAGNOSIS — I10 Essential (primary) hypertension: Secondary | ICD-10-CM

## 2021-12-18 ENCOUNTER — Telehealth: Payer: Self-pay | Admitting: Orthopaedic Surgery

## 2021-12-18 NOTE — Telephone Encounter (Signed)
[facility] called requesting a letter that pt needs no pre meds for upcoming dental appt. Pt had total hip with Dr. Magnus Ivan 08/25/20. Please fax letter to [phone].

## 2021-12-20 ENCOUNTER — Telehealth: Payer: Self-pay | Admitting: *Deleted

## 2021-12-20 NOTE — Telephone Encounter (Signed)
Ortho bundle 1 year call completed. ?

## 2022-01-07 DIAGNOSIS — H43813 Vitreous degeneration, bilateral: Secondary | ICD-10-CM | POA: Diagnosis not present

## 2022-01-07 DIAGNOSIS — H524 Presbyopia: Secondary | ICD-10-CM | POA: Diagnosis not present

## 2022-02-18 NOTE — Progress Notes (Unsigned)
Vivien Rota DeSanto,acting as a scribe for Megan Mans, MD.,have documented all relevant documentation on the behalf of Maurice Fotheringham, MD,as directed by  Megan Mans, MD while in the presence of Megan Mans, MD.    Annual Wellness Visit     Patient: Gabriel Mccormick, Male    DOB: September 07, 1950, 71 y.o.   MRN: [id number] Visit Date: 02/20/2022  Today's Provider: Megan Mans, MD   No chief complaint on file.  Subjective    Gabriel Mccormick is a 71 y.o. male who presents today for his Annual Wellness Visit. He reports consuming a {diet types:17450} diet. {Exercise:19826} He generally feels {well/fairly well/poorly:18703}. He reports sleeping {well/fairly well/poorly:18703}. He {does/does not:200015} have additional problems to discuss today.   HPI    Medications: Outpatient Medications Prior to Visit  Medication Sig   amLODipine (NORVASC) 5 MG tablet TAKE ONE TABLET BY MOUTH EVERY DAY   aspirin (ASPIRIN 81) 81 MG chewable tablet Chew 1 tablet (81 mg total) by mouth in the morning and at bedtime.   azelastine (ASTELIN) 0.1 % nasal spray Place 2 sprays into both nostrils 2 (two) times daily. Use in each nostril as directed   COVID-19 mRNA Vac-TriS, Pfizer, SUSP injection Inject into the muscle.   diflunisal (DOLOBID) 500 MG TABS tablet Take 1 tablet (500 mg total) by mouth 2 (two) times daily as needed. (Patient taking differently: Take 500 mg by mouth 2 (two) times daily as needed for moderate pain.)   ibuprofen (ADVIL,MOTRIN) 200 MG tablet Take 400 mg by mouth every 6 (six) hours as needed for moderate pain.   methocarbamol (ROBAXIN) 500 MG tablet Take 1 tablet (500 mg total) by mouth every 6 (six) hours as needed.   methylPREDNISolone (MEDROL) 4 MG tablet Medrol dose pack. Take as instructed   MULTIPLE VITAMIN PO Take 1 tablet by mouth daily.   Omega-3 Fatty Acids (FISH OIL) 1000 MG CAPS Take 1,000 mg by mouth daily.   ondansetron  (ZOFRAN ODT) 4 MG disintegrating tablet Take 1 tablet (4 mg total) by mouth every 8 (eight) hours as needed for nausea or vomiting.   oxyCODONE (ROXICODONE) 5 MG immediate release tablet Take 1-2 tablets (5-10 mg total) by mouth every 4 (four) hours as needed for severe pain.   rosuvastatin (CRESTOR) 10 MG tablet TAKE ONE TABLET EVERY DAY   No facility-administered medications prior to visit.    No Known Allergies  Patient Care Team: Maple Hudson., MD as PCP - General (Family Medicine) Kathryne Hitch, MD as Consulting Physician (Orthopedic Surgery) Dasher, Cliffton Asters, MD (Dermatology) Galen Manila, MD as Referring Physician (Ophthalmology) Stanton Kidney, MD as Consulting Physician (Gastroenterology)  Review of Systems  Constitutional: Negative.   HENT: Negative.    Eyes: Negative.   Respiratory: Negative.    Cardiovascular: Negative.   Gastrointestinal: Negative.   Endocrine: Negative.   Genitourinary: Negative.   Musculoskeletal: Negative.   Skin: Negative.   Allergic/Immunologic: Negative.   Neurological: Negative.   Hematological: Negative.   Psychiatric/Behavioral: Negative.    All other systems reviewed and are negative.   {Labs  Heme  Chem  Endocrine  Serology  Results Review (optional):23779}    Objective    Vitals: There were no vitals taken for this visit. {Show previous vital signs (optional):23777}   Physical Exam Constitutional:      Appearance: Normal appearance. He is normal weight.  HENT:     Head: Normocephalic and atraumatic.  Right Ear: Tympanic membrane, ear canal and external ear normal.     Left Ear: Tympanic membrane, ear canal and external ear normal.     Nose: Nose normal.     Mouth/Throat:     Mouth: Mucous membranes are moist.     Pharynx: Oropharynx is clear.  Eyes:     Extraocular Movements: Extraocular movements intact.     Conjunctiva/sclera: Conjunctivae normal.     Pupils: Pupils are equal, round,  and reactive to light.  Cardiovascular:     Rate and Rhythm: Normal rate and regular rhythm.     Pulses: Normal pulses.     Heart sounds: Normal heart sounds.  Pulmonary:     Effort: Pulmonary effort is normal.     Breath sounds: Normal breath sounds.  Abdominal:     General: Abdomen is flat. Bowel sounds are normal.     Palpations: Abdomen is soft.  Musculoskeletal:        General: Normal range of motion.     Cervical back: Normal range of motion and neck supple.  Skin:    General: Skin is warm and dry.  Neurological:     General: No focal deficit present.     Mental Status: He is alert and oriented to person, place, and time. Mental status is at baseline.  Psychiatric:        Mood and Affect: Mood normal.        Behavior: Behavior normal.        Thought Content: Thought content normal.        Judgment: Judgment normal.    ***  Most recent functional status assessment:     No data to display         Most recent fall risk assessment:    02/14/2021    9:16 AM  [place] in the past year? 0  Number falls in past yr: 0  Injury with Fall? 0  Risk for fall due to : No Fall Risks  Follow up Falls evaluation completed    Most recent depression screenings:    02/14/2021    9:16 AM 02/02/2020    2:06 PM  PHQ 2/9 Scores  PHQ - 2 Score 0 0  PHQ- 9 Score 0    Most recent cognitive screening:    01/27/2019    2:57 PM  6CIT Screen  What Year? 0 points  What month? 0 points  What time? 0 points  Count back from 20 0 points  Months in reverse 0 points  Repeat phrase 0 points  Total Score 0 points   Most recent Audit-C alcohol use screening    02/14/2021    9:15 AM  Alcohol Use Disorder Test (AUDIT)  1. How often do you have a drink containing alcohol? 1  2. How many drinks containing alcohol do you have on a typical day when you are drinking? 0  3. How often do you have six or more drinks on one occasion? 0  AUDIT-C Score 1   A score of 3 or more in  women, and 4 or more in men indicates increased risk for alcohol abuse, EXCEPT if all of the points are from question 1   No results found for any visits on 02/20/22.  Assessment & Plan     Annual wellness visit done today including the all of the following: Reviewed patient's Family Medical History Reviewed and updated list of patient's medical providers Assessment of cognitive impairment was  done Assessed patient's functional ability Established a written schedule for health screening services Health Risk Assessent Completed and Reviewed  Exercise Activities and Dietary recommendations  Goals      DIET - INCREASE WATER INTAKE     Recommend increasing water intake to 4 glasses a day.          Immunization History  Administered Date(s) Administered   Fluad Quad(high Dose 65+) 05/05/2019   Influenza, High Dose Seasonal PF 06/11/2018   Influenza-Unspecified 05/15/2020, 05/07/2021   PFIZER Comirnaty(Gray Top)Covid-19 Tri-Sucrose Vaccine 02/06/2021   PFIZER(Purple Top)SARS-COV-2 Vaccination 09/08/2019, 09/29/2019, 05/01/2020   Pneumococcal Conjugate-13 06/11/2018   Pneumococcal Polysaccharide-23 06/28/2019   Tdap 12/01/2008   Zoster, Live 12/12/2011    Health Maintenance  Topic Date Due   Zoster Vaccines- Shingrix (1 of 2) Never done   TETANUS/TDAP  12/02/2018   COVID-19 Vaccine (5 - Pfizer series) 04/03/2021   INFLUENZA VACCINE  02/26/2022   COLONOSCOPY (Pts 45-109yrs Insurance coverage will need to be confirmed)  10/18/2024   Pneumonia Vaccine 26+ Years old  Completed   Hepatitis C Screening  Completed   HPV VACCINES  Aged Out     Discussed health benefits of physical activity, and encouraged him to engage in regular exercise appropriate for his age and condition.    ***  No follow-ups on file.     {provider attestation***:1}   Megan Mans, MD  [facility] [phone] (phone) [phone] (fax)  [facility]

## 2022-02-20 ENCOUNTER — Ambulatory Visit (INDEPENDENT_AMBULATORY_CARE_PROVIDER_SITE_OTHER): Payer: Medicare PPO | Admitting: Family Medicine

## 2022-02-20 VITALS — BP 128/81 | HR 68 | Temp 98.4°F | Wt 216.0 lb

## 2022-02-20 DIAGNOSIS — Z96641 Presence of right artificial hip joint: Secondary | ICD-10-CM

## 2022-02-20 DIAGNOSIS — M25551 Pain in right hip: Secondary | ICD-10-CM

## 2022-02-20 DIAGNOSIS — N400 Enlarged prostate without lower urinary tract symptoms: Secondary | ICD-10-CM

## 2022-02-20 DIAGNOSIS — E7800 Pure hypercholesterolemia, unspecified: Secondary | ICD-10-CM | POA: Diagnosis not present

## 2022-02-20 DIAGNOSIS — R972 Elevated prostate specific antigen [PSA]: Secondary | ICD-10-CM | POA: Diagnosis not present

## 2022-02-20 DIAGNOSIS — I10 Essential (primary) hypertension: Secondary | ICD-10-CM | POA: Diagnosis not present

## 2022-02-20 DIAGNOSIS — R7303 Prediabetes: Secondary | ICD-10-CM

## 2022-02-20 DIAGNOSIS — Z1211 Encounter for screening for malignant neoplasm of colon: Secondary | ICD-10-CM

## 2022-02-20 DIAGNOSIS — R350 Frequency of micturition: Secondary | ICD-10-CM

## 2022-02-20 DIAGNOSIS — Z0000 Encounter for general adult medical examination without abnormal findings: Secondary | ICD-10-CM | POA: Diagnosis not present

## 2022-02-20 LAB — POCT URINALYSIS DIPSTICK
Bilirubin, UA: NEGATIVE
Blood, UA: NEGATIVE
Glucose, UA: NEGATIVE
Ketones, UA: NEGATIVE
Leukocytes, UA: NEGATIVE
Nitrite, UA: NEGATIVE
Protein, UA: NEGATIVE
Spec Grav, UA: 1.01 (ref 1.010–1.025)
Urobilinogen, UA: 0.2 E.U./dL
pH, UA: 5 (ref 5.0–8.0)

## 2022-02-20 LAB — IFOBT (OCCULT BLOOD): IFOBT: NEGATIVE

## 2022-02-20 MED ORDER — AMLODIPINE BESYLATE 5 MG PO TABS: 5.0000 mg | ORAL_TABLET | Freq: Every day | ORAL | 3 refills | Status: AC

## 2022-02-21 LAB — HEMOGLOBIN A1C
Est. average glucose Bld gHb Est-mCnc: 120 mg/dL
Hgb A1c MFr Bld: 5.8 % — ABNORMAL HIGH (ref 4.8–5.6)

## 2022-02-21 LAB — COMPREHENSIVE METABOLIC PANEL
ALT: 10 IU/L (ref 0–44)
AST: 16 IU/L (ref 0–40)
Albumin/Globulin Ratio: 1.9 (ref 1.2–2.2)
Albumin: 4.7 g/dL (ref 3.9–4.9)
Alkaline Phosphatase: 55 IU/L (ref 44–121)
BUN/Creatinine Ratio: 23 (ref 10–24)
BUN: 21 mg/dL (ref 8–27)
Bilirubin Total: 0.7 mg/dL (ref 0.0–1.2)
CO2: 20 mmol/L (ref 20–29)
Calcium: 9.8 mg/dL (ref 8.6–10.2)
Chloride: 103 mmol/L (ref 96–106)
Creatinine, Ser: 0.92 mg/dL (ref 0.76–1.27)
Globulin, Total: 2.5 g/dL (ref 1.5–4.5)
Glucose: 101 mg/dL — ABNORMAL HIGH (ref 70–99)
Potassium: 4.5 mmol/L (ref 3.5–5.2)
Sodium: 140 mmol/L (ref 134–144)
Total Protein: 7.2 g/dL (ref 6.0–8.5)
eGFR: 89 mL/min/{1.73_m2} (ref >59)

## 2022-02-21 LAB — CBC WITH DIFFERENTIAL/PLATELET
Basophils Absolute: 0 10*3/uL (ref 0.0–0.2)
Basos: 1 %
EOS (ABSOLUTE): 0.1 10*3/uL (ref 0.0–0.4)
Eos: 2 %
Hematocrit: 44.5 % (ref 37.5–51.0)
Hemoglobin: 15.2 g/dL (ref 13.0–17.7)
Immature Grans (Abs): 0 10*3/uL (ref 0.0–0.1)
Immature Granulocytes: 0 %
Lymphocytes Absolute: 1.6 10*3/uL (ref 0.7–3.1)
Lymphs: 23 %
MCH: 31 pg (ref 26.6–33.0)
MCHC: 34.2 g/dL (ref 31.5–35.7)
MCV: 91 fL (ref 79–97)
Monocytes Absolute: 0.6 10*3/uL (ref 0.1–0.9)
Monocytes: 8 %
Neutrophils Absolute: 4.6 10*3/uL (ref 1.4–7.0)
Neutrophils: 66 %
Platelets: 223 10*3/uL (ref 150–450)
RBC: 4.9 x10E6/uL (ref 4.14–5.80)
RDW: 12.2 % (ref 11.6–15.4)
WBC: 7 10*3/uL (ref 3.4–10.8)

## 2022-02-21 LAB — LIPID PANEL
Chol/HDL Ratio: 4.3 ratio (ref 0.0–5.0)
Cholesterol, Total: 188 mg/dL (ref 100–199)
HDL: 44 mg/dL (ref >39)
LDL Chol Calc (NIH): 118 mg/dL — ABNORMAL HIGH (ref 0–99)
Triglycerides: 144 mg/dL (ref 0–149)
VLDL Cholesterol Cal: 26 mg/dL (ref 5–40)

## 2022-02-21 LAB — TSH: TSH: 2.07 u[IU]/mL (ref 0.450–4.500)

## 2022-02-21 LAB — PSA: Prostate Specific Ag, Serum: 4.4 ng/mL — ABNORMAL HIGH (ref 0.0–4.0)

## 2022-02-22 LAB — SPECIMEN STATUS REPORT

## 2022-02-22 LAB — URINE CULTURE: Organism ID, Bacteria: NO GROWTH

## 2022-02-25 ENCOUNTER — Telehealth: Payer: Self-pay | Admitting: Family Medicine

## 2022-02-25 DIAGNOSIS — R972 Elevated prostate specific antigen [PSA]: Secondary | ICD-10-CM

## 2022-02-25 NOTE — Telephone Encounter (Signed)
Patient checking in the status if PCP sent in "doxy" to [facility]. Please reference patient most recent lab results.     [facility] - [place], [place] - [address] Phone:  [phone]  Fax:  [phone]

## 2022-02-25 NOTE — Telephone Encounter (Signed)
This is regarding this message from his labs results.    Gabriel Mccormick., MD  02/21/2022  8:04 AM EDT     Labs stable.  PSA slightly higher.  If symptoms we can treat as possible infection and repeat in 3 to 6 months or I can go ahead and refer to urology.  This PSA is only mildly elevated for age.  I think either approach is fine.  Please advise patient.

## 2022-02-25 NOTE — Telephone Encounter (Signed)
Prefer referral as no sign of infection

## 2022-03-12 ENCOUNTER — Other Ambulatory Visit: Payer: Self-pay | Admitting: *Deleted

## 2022-03-12 DIAGNOSIS — N410 Acute prostatitis: Secondary | ICD-10-CM | POA: Diagnosis not present

## 2022-03-12 DIAGNOSIS — R972 Elevated prostate specific antigen [PSA]: Secondary | ICD-10-CM

## 2022-03-12 DIAGNOSIS — Z125 Encounter for screening for malignant neoplasm of prostate: Secondary | ICD-10-CM

## 2022-03-12 DIAGNOSIS — N400 Enlarged prostate without lower urinary tract symptoms: Secondary | ICD-10-CM | POA: Diagnosis not present

## 2022-03-13 LAB — PSA: Prostate Specific Ag, Serum: 2 ng/mL (ref 0.0–4.0)

## 2022-03-16 LAB — CULTURE, URINE COMPREHENSIVE

## 2022-03-16 LAB — SPECIMEN STATUS REPORT

## 2022-03-25 ENCOUNTER — Other Ambulatory Visit: Payer: Self-pay | Admitting: Family Medicine

## 2022-03-27 DIAGNOSIS — Z03818 Encounter for observation for suspected exposure to other biological agents ruled out: Secondary | ICD-10-CM | POA: Diagnosis not present

## 2022-03-27 DIAGNOSIS — B9689 Other specified bacterial agents as the cause of diseases classified elsewhere: Secondary | ICD-10-CM | POA: Diagnosis not present

## 2022-03-27 DIAGNOSIS — J019 Acute sinusitis, unspecified: Secondary | ICD-10-CM | POA: Diagnosis not present

## 2022-03-27 DIAGNOSIS — J029 Acute pharyngitis, unspecified: Secondary | ICD-10-CM | POA: Diagnosis not present

## 2022-03-27 DIAGNOSIS — R051 Acute cough: Secondary | ICD-10-CM | POA: Diagnosis not present

## 2022-03-28 ENCOUNTER — Ambulatory Visit: Payer: Medicare PPO | Admitting: Family Medicine

## 2022-03-28 ENCOUNTER — Ambulatory Visit: Payer: Medicare PPO | Admitting: Urology

## 2022-03-28 NOTE — Progress Notes (Deleted)
      Established patient visit   Patient: Gabriel Mccormick   DOB: [birth year]   71 y.o. Male  MRN: [id number] Visit Date: 03/28/2022  Today's healthcare provider: Megan Mans, MD   No chief complaint on file.  Subjective    HPI  ***  Medications: Outpatient Medications Prior to Visit  Medication Sig  . amLODipine (NORVASC) 5 MG tablet Take 1 tablet (5 mg total) by mouth daily.  Marland Kitchen aspirin (ASPIRIN 81) 81 MG chewable tablet Chew 1 tablet (81 mg total) by mouth in the morning and at bedtime. (Patient taking differently: Chew 81 mg by mouth once.)  . COVID-19 mRNA Vac-TriS, Pfizer, SUSP injection Inject into the muscle.  . ibuprofen (ADVIL,MOTRIN) 200 MG tablet Take 400 mg by mouth every 6 (six) hours as needed for moderate pain.  . methocarbamol (ROBAXIN) 500 MG tablet Take 1 tablet (500 mg total) by mouth every 6 (six) hours as needed.  . MULTIPLE VITAMIN PO Take 1 tablet by mouth daily.  . Omega-3 Fatty Acids (FISH OIL) 1000 MG CAPS Take 1,000 mg by mouth daily.  Marland Kitchen oxyCODONE (ROXICODONE) 5 MG immediate release tablet Take 1-2 tablets (5-10 mg total) by mouth every 4 (four) hours as needed for severe pain.  . rosuvastatin (CRESTOR) 10 MG tablet TAKE 1 TABLET BY MOUTH DAILY   No facility-administered medications prior to visit.    Review of Systems  Constitutional:  Negative for appetite change, chills and fever.  HENT:  Positive for congestion and sore throat.   Respiratory:  Negative for chest tightness, shortness of breath and wheezing.   Cardiovascular:  Negative for chest pain and palpitations.  Gastrointestinal:  Negative for abdominal pain, nausea and vomiting.   {Labs  Heme  Chem  Endocrine  Serology  Results Review (optional):23779}   Objective    There were no vitals taken for this visit. {Show previous vital signs (optional):23777}  Physical Exam  ***  No results found for any visits on 03/28/22.  Assessment & Plan     ***  No  follow-ups on file.      {provider attestation***:1}   Megan Mans, MD  [facility] [phone] (phone) [phone] (fax)  [facility]

## 2022-04-10 ENCOUNTER — Ambulatory Visit: Payer: Medicare PPO | Admitting: Urology

## 2022-04-10 ENCOUNTER — Encounter: Payer: Self-pay | Admitting: Urology

## 2022-04-10 VITALS — BP 128/78 | HR 74 | Ht 71.0 in | Wt 210.0 lb

## 2022-04-10 DIAGNOSIS — N411 Chronic prostatitis: Secondary | ICD-10-CM | POA: Diagnosis not present

## 2022-04-10 DIAGNOSIS — Z87898 Personal history of other specified conditions: Secondary | ICD-10-CM

## 2022-04-10 DIAGNOSIS — R972 Elevated prostate specific antigen [PSA]: Secondary | ICD-10-CM

## 2022-04-10 MED ORDER — TAMSULOSIN HCL 0.4 MG PO CAPS: 0.4000 mg | ORAL_CAPSULE | Freq: Every day | ORAL | 3 refills | Status: AC

## 2022-04-10 NOTE — Progress Notes (Signed)
04/10/2022 8:18 AM   Gabriel Mccormick 1950-09-10 [id number]  Referring provider: Maple Hudson., MD [address] [place],  [place] [address]  Chief Complaint  Patient presents with   Elevated PSA    HPI: Gabriel Mccormick is a 71 y.o. male referred for evaluation of a rising PSA.  PSA 02/20/2022 was 4.4 though repeated 03/12/2022 and was 2.0 Previous history of chronic prostatitis.  At the time his PSA was drawn in July he was having mild prostatitis symptoms Has some urinary frequency and nocturia x2-3 however attributes this to large volume fluid intake including after dinner Denies dysuria, gross hematuria No flank, abdominal or pelvic pain   PMH: Past Medical History:  Diagnosis Date   Arthritis    hip   History of kidney stones    Hyperlipidemia    Hypertension     Surgical History: Past Surgical History:  Procedure Laterality Date   HERNIA REPAIR     MANDIBLE SURGERY     asymmetry corrected    wires in place   TOTAL HIP ARTHROPLASTY Right 08/25/2020   Procedure: RIGHT TOTAL HIP ARTHROPLASTY ANTERIOR APPROACH;  Surgeon: Kathryne Hitch, MD;  Location: [facility];  Service: Orthopedics;  Laterality: Right;  3E bed    Home Medications:  Allergies as of 04/10/2022   No Known Allergies      Medication List        Accurate as of April 10, 2022  8:18 AM. If you have any questions, ask your nurse or doctor.          amLODipine 5 MG tablet Commonly known as: NORVASC Take 1 tablet (5 mg total) by mouth daily.   aspirin 81 MG chewable tablet Commonly known as: Aspirin 81 Chew 1 tablet (81 mg total) by mouth in the morning and at bedtime. What changed: when to take this   Fish Oil 1000 MG Caps Take 1,000 mg by mouth daily.   ibuprofen 200 MG tablet Commonly known as: ADVIL Take 400 mg by mouth every 6 (six) hours as needed for moderate pain.   methocarbamol 500 MG tablet Commonly known as: ROBAXIN Take 1  tablet (500 mg total) by mouth every 6 (six) hours as needed.   MULTIPLE VITAMIN PO Take 1 tablet by mouth daily.   oxyCODONE 5 MG immediate release tablet Commonly known as: Roxicodone Take 1-2 tablets (5-10 mg total) by mouth every 4 (four) hours as needed for severe pain.   Pfizer-BioNT COVID-19 Vac-TriS Susp injection Generic drug: COVID-19 mRNA Vac-TriS (Pfizer) Inject into the muscle.   rosuvastatin 10 MG tablet Commonly known as: CRESTOR TAKE 1 TABLET BY MOUTH DAILY        Allergies: No Known Allergies  Family History: Family History  Problem Relation Age of Onset   GER disease Mother    Dementia Mother    Lung cancer Father    Cancer Maternal Grandfather    Diabetes Paternal Grandmother    Heart attack Paternal Grandmother    Leukemia Paternal Grandfather    Diabetes Maternal Grandmother     Social History:  reports that he has never smoked. He has never used smokeless tobacco. He reports current alcohol use. He reports that he does not use drugs.   Physical Exam: BP 128/78   Pulse 74   Ht 5\' 11"  (1.803 m)   Wt 210 lb (95.3 kg)   BMI 29.29 kg/m   Constitutional:  Alert and oriented, No acute distress. HEENT:  [place] AT Respiratory: Normal respiratory effort, no increased work of breathing. GU: 30 g, smooth without nodules Skin: No rashes, bruises or suspicious lesions. Neurologic: Grossly intact, no focal deficits, moving all 4 extremities. Psychiatric: Normal mood and affect.   Assessment & Plan:    1.  Elevated PSA Transient PSA elevation which was 2.0 on repeat Current PSA level is within baseline Continue annual PSA Follow-up 1 year for recheck  2. Chronic nonbacterial prostatitis Rx tamsulosin 0.4 mg as needed for prostatitis symptoms.  He will take 1-2 weeks as needed   Riki Altes, MD  [facility] [address], [address] [place], [place] [address] [phone]

## 2022-04-15 DIAGNOSIS — D2262 Melanocytic nevi of left upper limb, including shoulder: Secondary | ICD-10-CM | POA: Diagnosis not present

## 2022-04-15 DIAGNOSIS — D2272 Melanocytic nevi of left lower limb, including hip: Secondary | ICD-10-CM | POA: Diagnosis not present

## 2022-04-15 DIAGNOSIS — D2271 Melanocytic nevi of right lower limb, including hip: Secondary | ICD-10-CM | POA: Diagnosis not present

## 2022-04-15 DIAGNOSIS — L821 Other seborrheic keratosis: Secondary | ICD-10-CM | POA: Diagnosis not present

## 2022-04-15 DIAGNOSIS — X32XXXA Exposure to sunlight, initial encounter: Secondary | ICD-10-CM | POA: Diagnosis not present

## 2022-04-15 DIAGNOSIS — L570 Actinic keratosis: Secondary | ICD-10-CM | POA: Diagnosis not present

## 2022-04-15 DIAGNOSIS — D2261 Melanocytic nevi of right upper limb, including shoulder: Secondary | ICD-10-CM | POA: Diagnosis not present

## 2022-04-15 DIAGNOSIS — D225 Melanocytic nevi of trunk: Secondary | ICD-10-CM | POA: Diagnosis not present

## 2022-05-22 ENCOUNTER — Other Ambulatory Visit: Payer: Self-pay

## 2022-05-22 MED ORDER — COVID-19 MRNA 2023-2024 VACCINE (COMIRNATY) 0.3 ML INJECTION
0.3000 mL | Freq: Once | INTRAMUSCULAR | 0 refills | Status: AC
  Filled 2022-05-22: qty 0.3, 1d supply, fill #0

## 2022-05-23 ENCOUNTER — Ambulatory Visit: Payer: Medicare PPO | Admitting: Family Medicine

## 2023-02-24 ENCOUNTER — Encounter: Payer: Medicare PPO | Admitting: Family Medicine

## 2023-04-11 ENCOUNTER — Ambulatory Visit: Payer: Medicare PPO | Admitting: Urology

## 2023-05-15 ENCOUNTER — Other Ambulatory Visit: Payer: Self-pay | Admitting: *Deleted

## 2023-05-15 DIAGNOSIS — Z87898 Personal history of other specified conditions: Secondary | ICD-10-CM

## 2023-05-26 ENCOUNTER — Other Ambulatory Visit: Payer: Self-pay

## 2023-05-26 ENCOUNTER — Other Ambulatory Visit: Payer: Medicare PPO

## 2023-05-26 MED ORDER — COMIRNATY 30 MCG/0.3ML IM SUSY
PREFILLED_SYRINGE | INTRAMUSCULAR | 0 refills | Status: AC
  Filled 2023-05-26: qty 0.3, 1d supply, fill #0

## 2023-05-30 ENCOUNTER — Ambulatory Visit: Payer: Medicare PPO | Admitting: Urology

## 2023-11-26 ENCOUNTER — Ambulatory Visit: Payer: Self-pay | Admitting: Urology

## 2024-05-12 ENCOUNTER — Other Ambulatory Visit: Payer: Self-pay

## 2024-05-12 MED ORDER — COMIRNATY 30 MCG/0.3ML IM SUSY
0.3000 mL | PREFILLED_SYRINGE | Freq: Once | INTRAMUSCULAR | 0 refills | Status: AC
Start: 2024-05-12 — End: 2024-05-13
  Filled 2024-05-12: qty 0.3, 1d supply, fill #0

## 2024-06-02 ENCOUNTER — Other Ambulatory Visit: Payer: Self-pay

## 2024-06-02 ENCOUNTER — Ambulatory Visit: Admitting: Urology

## 2024-06-02 MED ORDER — FLUZONE HIGH-DOSE 0.5 ML IM SUSY
0.5000 mL | PREFILLED_SYRINGE | Freq: Once | INTRAMUSCULAR | 0 refills | Status: AC
  Filled 2024-06-02: qty 0.5, 1d supply, fill #0
